# Patient Record
Sex: Male | Born: 1955 | Race: White | Hispanic: No | Marital: Married | Smoking: Former smoker
Health system: Southern US, Community
[De-identification: ages and names within clinical notes are randomized; demographics above are authoritative.]

## PROBLEM LIST (undated history)

## (undated) DIAGNOSIS — J45909 Unspecified asthma, uncomplicated: Secondary | ICD-10-CM

## (undated) DIAGNOSIS — Z86010 Personal history of colonic polyps: Secondary | ICD-10-CM

## (undated) DIAGNOSIS — N200 Calculus of kidney: Secondary | ICD-10-CM

## (undated) DIAGNOSIS — E291 Testicular hypofunction: Secondary | ICD-10-CM

## (undated) DIAGNOSIS — J309 Allergic rhinitis, unspecified: Secondary | ICD-10-CM

## (undated) DIAGNOSIS — B019 Varicella without complication: Secondary | ICD-10-CM

## (undated) DIAGNOSIS — K219 Gastro-esophageal reflux disease without esophagitis: Secondary | ICD-10-CM

## (undated) HISTORY — DX: Allergic rhinitis, unspecified: J30.9

## (undated) HISTORY — DX: Varicella without complication: B01.9

## (undated) HISTORY — PX: CERVICAL DISC SURGERY: SHX588

## (undated) HISTORY — DX: Personal history of colonic polyps: Z86.010

## (undated) HISTORY — DX: Calculus of kidney: N20.0

## (undated) HISTORY — DX: Testicular hypofunction: E29.1

## (undated) HISTORY — DX: Unspecified asthma, uncomplicated: J45.909

## (undated) HISTORY — DX: Gastro-esophageal reflux disease without esophagitis: K21.9

---

## 2003-10-28 HISTORY — PX: CHOLECYSTECTOMY: SHX55

## 2008-10-27 DIAGNOSIS — N200 Calculus of kidney: Secondary | ICD-10-CM

## 2008-10-27 HISTORY — DX: Calculus of kidney: N20.0

## 2017-05-18 ENCOUNTER — Ambulatory Visit (INDEPENDENT_AMBULATORY_CARE_PROVIDER_SITE_OTHER): Payer: 59 | Admitting: Family Medicine

## 2017-05-18 ENCOUNTER — Encounter: Payer: Self-pay | Admitting: Family Medicine

## 2017-05-18 DIAGNOSIS — Z87891 Personal history of nicotine dependence: Secondary | ICD-10-CM | POA: Diagnosis not present

## 2017-05-18 DIAGNOSIS — E663 Overweight: Secondary | ICD-10-CM | POA: Insufficient documentation

## 2017-05-18 DIAGNOSIS — M722 Plantar fascial fibromatosis: Secondary | ICD-10-CM | POA: Diagnosis not present

## 2017-05-18 DIAGNOSIS — J4520 Mild intermittent asthma, uncomplicated: Secondary | ICD-10-CM

## 2017-05-18 DIAGNOSIS — Z86010 Personal history of colonic polyps: Secondary | ICD-10-CM | POA: Insufficient documentation

## 2017-05-18 DIAGNOSIS — N200 Calculus of kidney: Secondary | ICD-10-CM | POA: Insufficient documentation

## 2017-05-18 DIAGNOSIS — E291 Testicular hypofunction: Secondary | ICD-10-CM | POA: Diagnosis not present

## 2017-05-18 DIAGNOSIS — K219 Gastro-esophageal reflux disease without esophagitis: Secondary | ICD-10-CM | POA: Insufficient documentation

## 2017-05-18 DIAGNOSIS — J45909 Unspecified asthma, uncomplicated: Secondary | ICD-10-CM | POA: Insufficient documentation

## 2017-05-18 DIAGNOSIS — J301 Allergic rhinitis due to pollen: Secondary | ICD-10-CM | POA: Diagnosis not present

## 2017-05-18 DIAGNOSIS — J309 Allergic rhinitis, unspecified: Secondary | ICD-10-CM | POA: Insufficient documentation

## 2017-05-18 MED ORDER — ALBUTEROL SULFATE HFA 108 (90 BASE) MCG/ACT IN AERS: 2.0000 | INHALATION_SPRAY | Freq: Four times a day (QID) | RESPIRATORY_TRACT | 2 refills | Status: AC | PRN

## 2017-05-18 MED ORDER — SILDENAFIL CITRATE 20 MG PO TABS: ORAL_TABLET | ORAL | 3 refills | Status: DC

## 2017-05-18 NOTE — Assessment & Plan Note (Signed)
S:started in [employer]. sparing albuterol controls symptoms well A/P: well controlled asthma- refilled today to make sure he has one on hand.

## 2017-05-18 NOTE — Assessment & Plan Note (Signed)
S:Thinks original diagnosis by urology then managed by prior PCP. testosterone cypionate 200mg /ml- 1 ml q 14 days. wife gives injection A/P: Discussed with patient we needed records before prescribing medication. He is to sign a release of information. I asked him to check in 10 days from now with [place] to see if we have gotten records and sent in prescription

## 2017-05-18 NOTE — Assessment & Plan Note (Signed)
5.25 pack years. Quit 2001. Needs AAA screen at age 61

## 2017-05-18 NOTE — Assessment & Plan Note (Signed)
S: By BMI patient is obese. But he has large muscle mass for age.Works out 5-6 days a week. Bench presses 300 pounds Believes exercise is best medicine- weights, cardio as well. He has already lost weight. 208 hs weight- played Fball but has gained muscle mass A/P:Wants to lose 10 more lbs at least - 225 goal. 238 at home. I agreed with him he should at least lose 10-15 pounds. Reassess at that time suspect he may able to lose more

## 2017-05-18 NOTE — Progress Notes (Signed)
Phone: [phone]  Subjective:  Patient presents today to establish care.  Prior patient of Dr. Wallis Mart- [facility] (now he has moved) Lived in [place] since early 2016. Chief complaint-noted.   See problem oriented charting  The following were reviewed and entered/updated in epic: Past Medical History:  Diagnosis Date  . Allergic rhinitis    claritin in the spring  . Asthma    started in [employer]. sparing albuterol  . Chicken pox   . GERD (gastroesophageal reflux disease)   . History of adenomatous polyp of colon    age 50- repeat planned at 37  . Hypogonadism in male    by prior PCP. testosterone cypionate 21m/ml- 1 ml q 14 days. wife gives injection  . Nephrolithiasis    x1 years ago   Patient Active Problem List   Diagnosis Date Noted  . Hypogonadism in male     Priority: High  . Plantar fasciitis, left 05/18/2017    Priority: Medium  . Asthma     Priority: Medium  . History of adenomatous polyp of colon     Priority: Medium  . Overweight 05/18/2017    Priority: Low  . Former smoker 05/18/2017    Priority: Low  . Allergic rhinitis     Priority: Low  . GERD (gastroesophageal reflux disease)     Priority: Low  . Nephrolithiasis     Priority: Low   Past Surgical History:  Procedure Laterality Date  . CERVICAL DISC SURGERY     c6-c7 fusion- used to jump out of airplanes  . CHOLECYSTECTOMY  2005    Family History  Problem Relation Age of Onset  . Diabetes Mother        died at 842 . Hyperlipidemia Mother   . Hypertension Mother   . CAD Mother        1 year after husband passed  . Hypertension Father   . Asthma Father   . Stroke Father        died 967 after falling and breaking 2 vertebrae and getting PNA  . Breast cancer Sister   . Alcoholism Brother        [place] . CAD Brother        cabg. smoker. 658 . Other Brother        opiate addiction  . Stomach cancer Maternal Grandmother        ? colon  . CAD Maternal Grandfather        died  541 drinker, not smoker  . Other Paternal Grandmother        pna 935 . CAD Paternal Grandfather        died 693 drink and smoking  . Lung cancer Maternal Uncle        smoker    Medications- reviewed and updated Current Outpatient Prescriptions  Medication Sig Dispense Refill  . Cholecalciferol (D3 HIGH POTENCY) 2000 units CAPS Take 1 capsule by mouth daily.    . Multiple Vitamin (MULTIVITAMIN) tablet Take 1 tablet by mouth daily.    . sildenafil (REVATIO) 20 MG tablet Take 2-5 tablets by mouth 30-60 minutes prior to sexual intercourse 90 tablet 3  . Testosterone Cypionate 200 MG/ML KIT Inject 1 mL into the muscle every 14 (fourteen) days.    .Marland Kitchenalbuterol (PROVENTIL HFA;VENTOLIN HFA) 108 (90 Base) MCG/ACT inhaler Inhale 2 puffs into the lungs every 6 (six) hours as needed for wheezing or shortness of breath. 1 Inhaler 2   No  current facility-administered medications for this visit.     Allergies-reviewed and updated No Known Allergies  Social History   Social History  . Marital status: Married    Spouse name: N/A  . Number of children: N/A  . Years of education: N/A   Social History Main Topics  . Smoking status: Former Smoker    Packs/day: 0.75    Years: 7.00    Quit date: 10/28/1999  . Smokeless tobacco: Never Used  . Alcohol use 1.2 - 1.8 oz/week    2 - 3 Standard drinks or equivalent per week  . Drug use: Unknown  . Sexual activity: Not Asked   Other Topics Concern  . None   Social History Narrative   Remarried. 3 children- 58, 34 (from 1st marriage- mom had decided didn't want to be a mom), 39 in 2018. 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  HENT: Negative for hearing loss and tinnitus.   Eyes: Negative for blurred vision and double vision.  Respiratory: Negative for cough and hemoptysis.   Cardiovascular: Negative for chest pain and palpitations.  Gastrointestinal: Negative for heartburn and nausea.  Genitourinary: Negative for dysuria and urgency.  Musculoskeletal: Positive for joint pain (knee pain- likely oa). Negative for back pain.  Skin: Negative for itching and rash.  Neurological: Negative for dizziness and headaches.  Endo/Heme/Allergies: Negative for polydipsia. Does not bruise/bleed easily.  Psychiatric/Behavioral: Negative for substance abuse and suicidal ideas.   Objective: BP 134/82 (BP Location: Left Arm, Patient Position: Sitting, Cuff Size: Large)   Pulse 69   Temp 98 F (36.7 C) (Oral)   Ht 6' 1.5" (1.867 m)   Wt 245 lb (111.1 kg)   SpO2 98%   BMI 31.89 kg/m  Gen: NAD, resting comfortably HEENT: Mucous membranes are moist. Oropharynx normal. TM normal. Eyes: sclera and lids normal, PERRLA Neck: no thyromegaly, no cervical lymphadenopathy CV: RRR no murmurs rubs or gallops Lungs: CTAB no crackles, wheeze, rhonchi Abdomen: soft/nontender/nondistended/normal bowel sounds. No rebound or guarding.  Ext: no edema Skin: warm, dry Neuro: 5/5 strength in upper and lower extremities, normal gait, normal reflexes MSK: Very tender to palpation in left heel at the insertion point of plantar fascia  Assessment/Plan:  Overweight S: By BMI patient is obese. But he has large muscle mass for age.Works out 5-6 days a week. Bench presses 300 pounds Believes exercise is best medicine- weights, cardio as well. He has already lost weight. 208 hs weight- played Fball but has gained muscle mass A/P:Wants to lose 10 more lbs at least - 225 goal. 238 at home. I agreed with him he should at least lose 10-15 pounds. Reassess at that time suspect he may able to lose  more  Former smoker 5.25 pack years. Quit 2001. Needs AAA screen at age 8  Hypogonadism in male S:Thinks original diagnosis by urology then managed by prior PCP. testosterone cypionate 267m/ml- 1 ml q 14 days. wife gives injection A/P: Discussed with patient we needed records before prescribing medication. He is to sign a release  of information. I asked him to check in 10 days from now with [place] to see if we have gotten records and sent in prescription   Asthma S:started in [employer]. sparing albuterol controls symptoms well A/P: well controlled asthma- refilled today to make sure he has one on hand.    Plantar fasciitis, left S: Complains of intense heel pain in the morning with first step. Usually gets better with activity. Can recur during the day as he rests. Has been going on for 2-3 months.  A/P: Exam and history suggestive of plantar fasciitis. Home exercises recommended. Gel heel cups were recommended. Follow-up at physical  Physical in one month  Meds ordered this encounter  Medications  . Testosterone Cypionate 200 MG/ML KIT    Sig: Inject 1 mL into the muscle every 14 (fourteen) days.  . Multiple Vitamin (MULTIVITAMIN) tablet    Sig: Take 1 tablet by mouth daily.  . Cholecalciferol (D3 HIGH POTENCY) 2000 units CAPS    Sig: Take 1 capsule by mouth daily.  Marland Kitchen DISCONTD: sildenafil (REVATIO) 20 MG tablet    Sig: Take 20 mg by mouth as needed. Take 1-5 tablets by mouth 30-60 minutes prior to sexual intercourse  . albuterol (PROVENTIL HFA;VENTOLIN HFA) 108 (90 Base) MCG/ACT inhaler    Sig: Inhale 2 puffs into the lungs every 6 (six) hours as needed for wheezing or shortness of breath.    Dispense:  1 Inhaler    Refill:  2  . sildenafil (REVATIO) 20 MG tablet    Sig: Take 2-5 tablets by mouth 30-60 minutes prior to sexual intercourse    Dispense:  90 tablet    Refill:  3   Return precautions advised. Garret Reddish, MD

## 2017-05-18 NOTE — Assessment & Plan Note (Signed)
S: Complains of intense heel pain in the morning with first step. Usually gets better with activity. Can recur during the day as he rests. Has been going on for 2-3 months.  A/P: Exam and history suggestive of plantar fasciitis. Home exercises recommended. Gel heel cups were recommended. Follow-up at physical

## 2017-05-18 NOTE — Patient Instructions (Addendum)
Consider/read about Shingrix- the new 2 shot series for shingles  Sign release of information at the check out desk for colonoscopy.   Sign release of information at the check out desk for records from prior doctor- need all records since in [place] particularly info on testosterone  Refilled albuterol  Love your weight goal of 225  Schedule physical in 1 month in hopes we get most of records back  I would check in 10 days from now to see if we have sent in testosterone if you haven't heard

## 2017-05-21 ENCOUNTER — Telehealth: Payer: Self-pay | Admitting: Family Medicine

## 2017-05-21 DIAGNOSIS — E291 Testicular hypofunction: Secondary | ICD-10-CM

## 2017-05-21 NOTE — Telephone Encounter (Signed)
I would personally have never tarted patient on testosterone based on test results. From prior physician assistant Glendora Score- "testosterone levels from prior lab testing were low normal for age"   Labs 06/22/15 were all normal before testosterone was started. Low normal is not a reason to start in my book.   Roselyn Reef- can you please inform patient that I will not be refilling testosterone. I would prefer he come off the injections completely then retest. If he is low on 2/3 on free testosterone then I can refer him to urology for new start treatment. These will need to be fasting labs between 8-9 Am and we can discuss this at future visit getting this set up.   Also for records: He was given injections in office up to 200/mg/ml q2 weeks then transitioned to home. on 10/06/16 patient had refill for testosterone- testosterone cypionate (depo-testosterone) 100mg /mL injection. Inject 1 mL into the thigh every 14 days.

## 2017-05-21 NOTE — Assessment & Plan Note (Signed)
I would personally have never tarted patient on testosterone based on test results. From prior physician assistant Glendora Score- "testosterone levels from prior lab testing were low normal for age"   Labs 06/22/15 were all normal before testosterone was started. Low normal is not a reason to start in my book.   Roselyn Reef- can you please inform patient that I will not be refilling testosterone. I would prefer he come off the injections completely then retest. If he is low on 2/3 on free testosterone then I can refer him to urology for new start treatment. These will need to be fasting labs between 8-9 Am and we can discuss this at future visit getting this set up.   Also for records: He was given injections in office up to 200/mg/ml q2 weeks then transitioned to home. on 10/06/16 patient had refill for testosterone- testosterone cypionate (depo-testosterone) 100mg /mL injection. Inject 1 mL into the thigh every 14 days.

## 2017-05-25 NOTE — Telephone Encounter (Signed)
Spoke with patient who verbalized understanding.

## 2017-05-25 NOTE — Telephone Encounter (Signed)
Called and left a voicemail message asking for a return phone call 

## 2017-06-23 ENCOUNTER — Ambulatory Visit (INDEPENDENT_AMBULATORY_CARE_PROVIDER_SITE_OTHER): Payer: 59 | Admitting: Family Medicine

## 2017-06-23 ENCOUNTER — Encounter: Payer: Self-pay | Admitting: Family Medicine

## 2017-06-23 VITALS — BP 118/78 | HR 60 | Temp 97.8°F | Ht 73.0 in | Wt 245.0 lb

## 2017-06-23 DIAGNOSIS — Z23 Encounter for immunization: Secondary | ICD-10-CM | POA: Diagnosis not present

## 2017-06-23 DIAGNOSIS — Z125 Encounter for screening for malignant neoplasm of prostate: Secondary | ICD-10-CM

## 2017-06-23 DIAGNOSIS — E291 Testicular hypofunction: Secondary | ICD-10-CM | POA: Diagnosis not present

## 2017-06-23 DIAGNOSIS — Z87891 Personal history of nicotine dependence: Secondary | ICD-10-CM

## 2017-06-23 DIAGNOSIS — Z13220 Encounter for screening for lipoid disorders: Secondary | ICD-10-CM | POA: Diagnosis not present

## 2017-06-23 DIAGNOSIS — Z0000 Encounter for general adult medical examination without abnormal findings: Secondary | ICD-10-CM | POA: Diagnosis not present

## 2017-06-23 DIAGNOSIS — J4520 Mild intermittent asthma, uncomplicated: Secondary | ICD-10-CM | POA: Diagnosis not present

## 2017-06-23 DIAGNOSIS — M722 Plantar fascial fibromatosis: Secondary | ICD-10-CM

## 2017-06-23 DIAGNOSIS — Z86010 Personal history of colonic polyps: Secondary | ICD-10-CM

## 2017-06-23 LAB — POC URINALSYSI DIPSTICK (AUTOMATED)
BILIRUBIN UA: NEGATIVE
Clarity, UA: NEGATIVE
GLUCOSE UA: NEGATIVE
Ketones, UA: NEGATIVE
Leukocytes, UA: NEGATIVE
Nitrite, UA: NEGATIVE
PH UA: 6 (ref 5.0–8.0)
Protein, UA: NEGATIVE
RBC UA: NEGATIVE
SPEC GRAV UA: 1.02 (ref 1.010–1.025)
UROBILINOGEN UA: 0.2 U/dL

## 2017-06-23 LAB — COMPREHENSIVE METABOLIC PANEL
ALBUMIN: 4.5 g/dL (ref 3.5–5.2)
ALK PHOS: 55 U/L (ref 39–117)
ALT: 27 U/L (ref 0–53)
AST: 27 U/L (ref 0–37)
BILIRUBIN TOTAL: 0.8 mg/dL (ref 0.2–1.2)
BUN: 19 mg/dL (ref 6–23)
CO2: 33 mEq/L — ABNORMAL HIGH (ref 19–32)
Calcium: 9.6 mg/dL (ref 8.4–10.5)
Chloride: 101 mEq/L (ref 96–112)
Creatinine, Ser: 1.18 mg/dL (ref 0.40–1.50)
GFR: 67.15 mL/min (ref >60.00)
GLUCOSE: 89 mg/dL (ref 70–99)
POTASSIUM: 4.2 meq/L (ref 3.5–5.1)
Sodium: 139 mEq/L (ref 135–145)
TOTAL PROTEIN: 7.2 g/dL (ref 6.0–8.3)

## 2017-06-23 LAB — CBC
HEMATOCRIT: 46.5 % (ref 39.0–52.0)
HEMOGLOBIN: 15.5 g/dL (ref 13.0–17.0)
MCHC: 33.4 g/dL (ref 30.0–36.0)
MCV: 90.6 fl (ref 78.0–100.0)
PLATELETS: 261 10*3/uL (ref 150.0–400.0)
RBC: 5.13 Mil/uL (ref 4.22–5.81)
RDW: 13.9 % (ref 11.5–15.5)
WBC: 5.3 10*3/uL (ref 4.0–10.5)

## 2017-06-23 LAB — LIPID PANEL
CHOL/HDL RATIO: 4
Cholesterol: 198 mg/dL (ref 0–200)
HDL: 48.7 mg/dL (ref >39.00)
LDL Cholesterol: 132 mg/dL — ABNORMAL HIGH (ref 0–99)
NONHDL: 149.34
Triglycerides: 87 mg/dL (ref 0.0–149.0)
VLDL: 17.4 mg/dL (ref 0.0–40.0)

## 2017-06-23 LAB — PSA: PSA: 1.09 ng/mL (ref 0.10–4.00)

## 2017-06-23 NOTE — Progress Notes (Signed)
Phone: [phone]  Subjective:  Patient presents today for their annual physical. Chief complaint-noted.   See problem oriented charting- ROS- full  review of systems was completed and negative except for: less fatigue and libido off testosterone  The following were reviewed and entered/updated in epic: Past Medical History:  Diagnosis Date  . Allergic rhinitis    claritin in the spring  . Asthma    started in army. sparing albuterol  . Chicken pox   . GERD (gastroesophageal reflux disease)   . History of adenomatous polyp of colon    age 53- repeat planned at 11  . Hypogonadism in male    by prior PCP. testosterone cypionate 249m/ml- 1 ml q 14 days. wife gives injection  . Nephrolithiasis    x1 years ago   Patient Active Problem List   Diagnosis Date Noted  . Hypogonadism in male     Priority: High  . Plantar fasciitis, left 05/18/2017    Priority: Medium  . Asthma     Priority: Medium  . History of adenomatous polyp of colon     Priority: Medium  . Overweight 05/18/2017    Priority: Low  . Former smoker 05/18/2017    Priority: Low  . Allergic rhinitis     Priority: Low  . GERD (gastroesophageal reflux disease)     Priority: Low  . Nephrolithiasis     Priority: Low   Past Surgical History:  Procedure Laterality Date  . CERVICAL DISC SURGERY     c6-c7 fusion- used to jump out of airplanes  . CHOLECYSTECTOMY  2005    Family History  Problem Relation Age of Onset  . Diabetes Mother        died at 869 . Hyperlipidemia Mother   . Hypertension Mother   . CAD Mother        1 year after husband passed  . Hypertension Father   . Asthma Father   . Stroke Father        died 933 after falling and breaking 2 vertebrae and getting PNA  . Breast cancer Sister   . Alcoholism Brother        [place] . CAD Brother        cabg. smoker. 675 . Other Brother        opiate addiction  . Stomach cancer Maternal Grandmother        ? colon  . CAD Maternal  Grandfather        died 577 drinker, not smoker  . Other Paternal Grandmother        pna 937 . CAD Paternal Grandfather        died 645 drink and smoking  . Lung cancer Maternal Uncle        smoker    Medications- reviewed and updated Current Outpatient Prescriptions  Medication Sig Dispense Refill  . albuterol (PROVENTIL HFA;VENTOLIN HFA) 108 (90 Base) MCG/ACT inhaler Inhale 2 puffs into the lungs every 6 (six) hours as needed for wheezing or shortness of breath. 1 Inhaler 2  . Cholecalciferol (D3 HIGH POTENCY) 2000 units CAPS Take 1 capsule by mouth daily.    . Multiple Vitamin (MULTIVITAMIN) tablet Take 1 tablet by mouth daily.    . sildenafil (REVATIO) 20 MG tablet Take 2-5 tablets by mouth 30-60 minutes prior to sexual intercourse 90 tablet 3  . Testosterone Cypionate 200 MG/ML KIT Inject 1 mL into the muscle every 14 (fourteen) days.     No current  facility-administered medications for this visit.     Allergies-reviewed and updated No Known Allergies  Social History   Social History  . Marital status: Married    Spouse name: N/A  . Number of children: N/A  . Years of education: N/A   Social History Main Topics  . Smoking status: Former Smoker    Packs/day: 0.75    Years: 7.00    Quit date: 10/28/1999  . Smokeless tobacco: Never Used  . Alcohol use 1.2 - 1.8 oz/week    2 - 3 Standard drinks or equivalent per week  . Drug use: Unknown  . Sexual activity: Not Asked   Other Topics Concern  . None   Social History Narrative   Remarried. 3 children- 26, 29 (from 1st marriage- mom had decided didn't want to be a mom), 28 in 2018. 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 Oropharynx normal Neck: no thyromegaly CV: RRR no murmurs rubs or gallops Lungs: CTAB no crackles, wheeze, rhonchi Abdomen: soft/nontender/nondistended/normal bowel sounds. No rebound or guarding.  Ext: no edema Skin: warm, dry Neuro: grossly normal, moves all extremities, PERRLA Rectal: normal tone, diffusely enlarged prostate, no masses or tenderness  Assessment/Plan:  61 y.o. male presenting for annual physical.  Health Maintenance counseling: 1. Anticipatory guidance: Patient counseled regarding regular dental exams -q6 months for most part, eye exams - annual exams due to lasik, wearing seatbelts.  2. Risk factor reduction:  Advised patient of need for regular exercise and diet rich and fruits and vegetables to reduce risk of heart attack and stroke. Exercise- 5-6 days a week. Diet-balanced. Large muscle mass- discussed again at least 10-15 lbs weigh loss.  Wt Readings from Last 3 Encounters:  06/23/17 245 lb (111.1 kg)  05/18/17 245 lb (111.1 kg)  3. Immunizations/screenings/ancillary studies- flu shot today . Tdap advised today. No hiv or hcv screen as gives blood regularly 4. Prostate cancer screening-  advised psa with labs, low risk rectal  Lab Results  Component Value Date   PSA 1.09 06/23/2017  5. Colon cancer screening -  polyp age 39 - planned repeat at age 66.  63. Skin cancer screening- advised regular sunscreen use. Denies worrisome, changing, or new skin lesions.   Status of chronic or acute concerns   Asthma- sparing albuterol  Plantar fasciitis- trial exercises and gel cups. Improved with these. SM referral if needed. Declines for now   claritin D last night- advised to avoid- initial BP up but better on repeat  BP Readings from Last 3 Encounters:    06/23/17 118/78  05/18/17 134/82    Hypogonadism in male From prior provider reported  Hypogonadism- testosterone 237m q14 days injections by wife. Has been off injections for 1 month now. His levels while low normal are still normal (on every check he has had to  date) and I would not continue medications at this level- he could get urology opinion if he would like- we offered this and he accepted. Complains of poor energy and low libido off testosterone. Lab Results  Component Value Date   TESTOSTERONE 312.8 06/23/2017    1 year CPE  Orders Placed This Encounter  Procedures  . Flu Vaccine QUAD 6+ mos IM (Fluarix)  . Tdap vaccine greater than or equal to 7yo IM  . CBC    [place]  . Comprehensive metabolic panel    [place]    Order Specific Question:   Has the patient fasted?    Answer:   No  . Lipid panel    [place]    Order Specific Question:   Has the patient fasted?    Answer:   No  . PSA  . CP2130 testosterone, total and free  . POCT Urinalysis Dipstick (Automated)   Return precautions advised.  Garret Reddish, MD

## 2017-06-23 NOTE — Patient Instructions (Addendum)
Flu shot today. Tdap today.   Depending on testosterone levels may need to recheck to verify low numbers. With your muscle mass- I would be surprised if you have true low testosterone (then again you have been on treatment)  Please stop by lab before you go (after immunizations)

## 2017-06-24 LAB — CP2130 TESTOSTERONE, TOTAL AND FREE
Sex Hormone Binding: 35 nmol/L (ref 22–77)
TESTOSTERONE-% FREE: 1.9 % (ref 1.6–2.9)
Testosterone, Free: 59.6 pg/mL (ref 47.0–244.0)
Testosterone: 312.8 ng/dL (ref 300–890)

## 2017-06-26 ENCOUNTER — Other Ambulatory Visit: Payer: Self-pay

## 2017-06-26 DIAGNOSIS — E349 Endocrine disorder, unspecified: Secondary | ICD-10-CM

## 2017-06-28 NOTE — Assessment & Plan Note (Signed)
From prior provider reported  Hypogonadism- testosterone 200mg  q14 days injections by wife. Has been off injections for 1 month now. His levels while low normal are still normal (on every check he has had to date) and I would not continue medications at this level- he could get urology opinion if he would like- we offered this and he accepted. Complains of poor energy and low libido off testosterone. Lab Results  Component Value Date   TESTOSTERONE 312.8 06/23/2017

## 2018-03-18 ENCOUNTER — Ambulatory Visit: Payer: 59 | Admitting: Podiatry

## 2018-03-18 ENCOUNTER — Ambulatory Visit (INDEPENDENT_AMBULATORY_CARE_PROVIDER_SITE_OTHER): Payer: 59

## 2018-03-18 ENCOUNTER — Other Ambulatory Visit: Payer: Self-pay | Admitting: Podiatry

## 2018-03-18 ENCOUNTER — Encounter: Payer: Self-pay | Admitting: Podiatry

## 2018-03-18 VITALS — BP 134/79 | HR 63

## 2018-03-18 DIAGNOSIS — M7662 Achilles tendinitis, left leg: Secondary | ICD-10-CM

## 2018-03-18 DIAGNOSIS — M79672 Pain in left foot: Secondary | ICD-10-CM

## 2018-03-18 MED ORDER — TRIAMCINOLONE ACETONIDE 10 MG/ML IJ SUSP
10.0000 mg | Freq: Once | INTRAMUSCULAR | Status: AC
  Administered 2018-03-18: 10 mg

## 2018-03-18 NOTE — Patient Instructions (Signed)

## 2018-03-22 NOTE — Progress Notes (Signed)
Subjective:   Patient ID: Kurt Richmond, male   DOB: 62 y.o.   MRN: [id number]   HPI Patient presents stating that he is having a lot of pain in the back of his left heel and is been present for a little while and is worsened over the last month.  He is having trouble wearing shoes or being able to do activities currently patient does not smoke and likes to be active   Review of Systems  All other systems reviewed and are negative.       Objective:  Physical Exam  Constitutional: He appears well-developed and well-nourished.  Cardiovascular: Intact distal pulses.  Pulmonary/Chest: Effort normal.  Musculoskeletal: Normal range of motion.  Neurological: He is alert.  Skin: Skin is warm.  Nursing note and vitals reviewed.   Neurovascular status intact muscle strength is adequate range of motion within normal limits with patient found to have posterior lateral pain of the left Achilles with good function of the Achilles itself and no pain in the center or medial side of the tendon.  Patient has good digital perfusion and is well oriented x3     Assessment:  Acute Achilles tendinitis left with also history of mild plantar fasciitis left which may be contributory on the lateral side of the tendon     Plan:  H&P condition reviewed and discussed injection and possible risk of rupture associated with it.  Patient wants to proceed and today I did a careful injection of the lateral side of the Achilles 3 mg dexamethasone Kenalog 5 mg Xylocaine advised on ice therapy supportive shoes and reduced activity.  Reappoint in the next several weeks to recheck  X-rays indicate that there is small spur but no indications of stress fracture arthritis

## 2018-08-09 ENCOUNTER — Ambulatory Visit (INDEPENDENT_AMBULATORY_CARE_PROVIDER_SITE_OTHER): Payer: No Typology Code available for payment source

## 2018-08-09 ENCOUNTER — Encounter: Payer: Self-pay | Admitting: Podiatry

## 2018-08-09 ENCOUNTER — Other Ambulatory Visit: Payer: Self-pay | Admitting: Podiatry

## 2018-08-09 ENCOUNTER — Ambulatory Visit: Payer: No Typology Code available for payment source | Admitting: Podiatry

## 2018-08-09 DIAGNOSIS — M79672 Pain in left foot: Secondary | ICD-10-CM

## 2018-08-09 DIAGNOSIS — M779 Enthesopathy, unspecified: Secondary | ICD-10-CM

## 2018-08-09 MED ORDER — TRIAMCINOLONE ACETONIDE 10 MG/ML IJ SUSP
10.0000 mg | Freq: Once | INTRAMUSCULAR | Status: AC
  Administered 2018-08-09: 10 mg

## 2018-08-11 NOTE — Progress Notes (Signed)
Subjective:   Patient ID: Kurt Richmond, male   DOB: 62 y.o.   MRN: [id number]   HPI Patient has developed a lot of discomfort in the left second MPJ and states is been hurting for around 2 months with no history of injury   ROS      Objective:  Physical Exam  Neurovascular status intact muscle strength is adequate patient found to have inflammation pain around the second MPJ left with fluid buildup around the joint surface with mild discomfort in the third MPJ     Assessment:  Inflammatory capsulitis second MPJ left with mild in the third MPJ left     Plan:  H&P condition reviewed and recommended focusing on this joint surface.  I did a proximal block of the area I aspirated the joint getting out a small amount of clear fluid and I injected with quarter cc dexamethasone Kenalog and applied thick padding along with rigid bottom shoes to take pressure off the joint surface.  Patient will be seen back in 2 weeks and may require other treatments depending on response  X-ray indicates there is no signs of stress fracture or advanced arthritis the joint with no elongation of the metatarsal noted

## 2018-08-20 ENCOUNTER — Ambulatory Visit (INDEPENDENT_AMBULATORY_CARE_PROVIDER_SITE_OTHER): Payer: No Typology Code available for payment source | Admitting: Podiatry

## 2018-08-20 ENCOUNTER — Encounter: Payer: Self-pay | Admitting: Podiatry

## 2018-08-20 DIAGNOSIS — M79672 Pain in left foot: Secondary | ICD-10-CM

## 2018-08-20 DIAGNOSIS — M779 Enthesopathy, unspecified: Secondary | ICD-10-CM | POA: Diagnosis not present

## 2018-08-23 NOTE — Progress Notes (Signed)
Subjective:   Patient ID: Kurt Richmond, male   DOB: 62 y.o.   MRN: [id number]   HPI Patient presents stating the left foot seems to be improved   ROS      Objective:  Physical Exam  Neurovascular status intact with significant reduction of pain of the left plantar foot and left forefoot     Assessment:  Doing well post fasciitis capsulitis-like symptomatology     Plan:  Reviewed physical therapy anti-inflammatories and discussed shoe gear modifications and reappoint for [place] to recheck his symptoms indicate

## 2018-09-14 ENCOUNTER — Ambulatory Visit: Payer: No Typology Code available for payment source | Admitting: Family Medicine

## 2018-09-14 ENCOUNTER — Encounter: Payer: Self-pay | Admitting: Family Medicine

## 2018-09-14 VITALS — BP 138/80 | HR 67 | Ht 73.0 in | Wt 256.0 lb

## 2018-09-14 DIAGNOSIS — Z1211 Encounter for screening for malignant neoplasm of colon: Secondary | ICD-10-CM | POA: Diagnosis not present

## 2018-09-14 DIAGNOSIS — E291 Testicular hypofunction: Secondary | ICD-10-CM | POA: Insufficient documentation

## 2018-09-14 DIAGNOSIS — E559 Vitamin D deficiency, unspecified: Secondary | ICD-10-CM | POA: Insufficient documentation

## 2018-09-14 DIAGNOSIS — Z0000 Encounter for general adult medical examination without abnormal findings: Secondary | ICD-10-CM | POA: Insufficient documentation

## 2018-09-14 NOTE — Addendum Note (Signed)
Addended by: Abelino Derrick A on: 09/14/2018 12:02 PM   Modules accepted: Orders

## 2018-09-14 NOTE — Patient Instructions (Signed)

## 2018-09-14 NOTE — Progress Notes (Addendum)
Subjective:  Patient ID: Kurt Richmond, male    DOB: [birth year]  Age: 62 y.o. MRN: [id number]  CC: Establish Care   HPI Kurt Richmond presents for establishment of care and for physical exam.  He enjoys good health.  He works out 6 out of 7 days.  He is married.  He does not smoke or use illicit drugs.  He drinks alcohol on occasion.  He takes testosterone for androgen deficiency and says that it helps his energy levels as well as his sexual stamina.  Revatio works well for ED issues.  He has no issues with urine flow.  He uses albuterol inhalers rarely for wheezing.  He quit smoking many years ago.  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  Last colonoscopy was 5 years ago and there were polyps.  He was advised to return in 5 years.  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  Outpatient Medications Prior to Visit  Medication Sig Dispense Refill  . albuterol (PROVENTIL HFA;VENTOLIN HFA) 108 (90 Base) MCG/ACT inhaler Inhale 2 puffs into the lungs every 6 (six) hours as needed for wheezing or shortness of breath. 1 Inhaler 2  . Cholecalciferol (D3 HIGH POTENCY) 2000 units CAPS Take 1 capsule by mouth daily.    . Multiple Vitamin (MULTIVITAMIN) tablet Take 1 tablet by mouth daily.    . sildenafil (REVATIO) 20 MG tablet Take 2-5 tablets by mouth 30-60 minutes prior to sexual intercourse 90 tablet 3  . Testosterone Cypionate 200 MG/ML KIT Inject 1 mL into the muscle every 14 (fourteen) days.     No facility-administered medications prior to visit.     ROS Review of Systems  Constitutional: Negative.   HENT: Negative.   Eyes: Negative.   Respiratory: Negative.   Cardiovascular: Negative.   Gastrointestinal: Negative.   Endocrine: Negative for polyphagia and polyuria.  Genitourinary: Negative for difficulty urinating, frequency and urgency.  Musculoskeletal: Negative  for gait problem and joint swelling.  Skin: Negative for pallor.  Allergic/Immunologic: Negative for immunocompromised state.  Neurological: Negative for seizures, speech difficulty and weakness.  Hematological: Does not bruise/bleed easily.  Psychiatric/Behavioral: Negative.     Objective:  BP 138/80 (BP Location: Left Arm, Patient Position: Sitting, Cuff Size: Normal)   Pulse 67   Ht _0  (1.854 m)   Wt 256 lb (116.1 kg)   SpO2 97%   BMI 33.78 kg/m   BP Readings from Last 3 Encounters:  09/14/18 138/80  03/18/18 134/79  06/23/17 118/78    Wt Readings from Last 3 Encounters:  09/14/18 256 lb (116.1 kg)  06/23/17 245 lb (111.1 kg)  05/18/17 245 lb (111.1 kg)    Physical Exam  Constitutional: He is oriented to person, place, and time. He appears well-developed and well-nourished. No distress.  HENT:  Head: Normocephalic and atraumatic.  Right Ear: External ear normal.  Left Ear: External ear normal.  Mouth/Throat: Oropharynx is clear and moist.  Eyes: Pupils are equal, round, and reactive to light. Conjunctivae and EOM are normal. Right eye exhibits no discharge. Left eye exhibits no discharge. No scleral icterus.  Neck: Neck supple. No JVD present. No tracheal deviation present. No thyromegaly present.  Cardiovascular: Normal rate, regular rhythm and normal heart sounds.  Pulmonary/Chest: Effort normal and breath sounds normal.  Abdominal: Soft. Bowel sounds are normal. He exhibits no distension. There  is no tenderness. There is no guarding.  Genitourinary: Rectal exam shows external hemorrhoid. Rectal exam shows no internal hemorrhoid, no fissure, no mass, no tenderness and guaiac negative stool. Prostate is enlarged. Prostate is not tender.  Musculoskeletal: He exhibits no edema.  Lymphadenopathy:    He has no cervical adenopathy.  Neurological: He is alert and oriented to person, place, and time.  Skin: Skin is warm and dry. No rash noted. He is not diaphoretic. No  erythema.    Lab Results  Component Value Date   WBC 5.3 06/23/2017   HGB 15.5 06/23/2017   HCT 46.5 06/23/2017   PLT 261.0 06/23/2017   GLUCOSE 89 06/23/2017   CHOL 198 06/23/2017   TRIG 87.0 06/23/2017   HDL 48.70 06/23/2017   LDLCALC 132 (H) 06/23/2017   ALT 27 06/23/2017   AST 27 06/23/2017   NA 139 06/23/2017   K 4.2 06/23/2017   CL 101 06/23/2017   CREATININE 1.18 06/23/2017   BUN 19 06/23/2017   CO2 33 (H) 06/23/2017   PSA 1.09 06/23/2017    Patient was never admitted.  Assessment & Plan:   Kurt Richmond was seen today for establish care.  Diagnoses and all orders for this visit:  Androgen deficiency -     Testosterone; Future  Healthcare maintenance -     Comprehensive metabolic panel; Future -     CBC; Future -     Lipid panel; Future -     PSA; Future -     Urinalysis, Routine w reflex microscopic; Future  Vitamin D deficiency -     VITAMIN D 25 Hydroxy (Vit-D Deficiency, Fractures); Future  Screen for colon cancer -     Ambulatory referral to Gastroenterology   I am having Kurt Richmond maintain his Testosterone Cypionate, multivitamin, Cholecalciferol, albuterol, and sildenafil.  No orders of the defined types were placed in this encounter.  Patient will follow-up for above ordered lab work a week after his next testosterone injection.  Follow-up: Return in about 6 months (around 03/15/2019).  Libby Maw, MD

## 2018-09-17 ENCOUNTER — Other Ambulatory Visit (INDEPENDENT_AMBULATORY_CARE_PROVIDER_SITE_OTHER): Payer: No Typology Code available for payment source

## 2018-09-17 ENCOUNTER — Telehealth: Payer: Self-pay | Admitting: Family Medicine

## 2018-09-17 DIAGNOSIS — E291 Testicular hypofunction: Secondary | ICD-10-CM | POA: Diagnosis not present

## 2018-09-17 DIAGNOSIS — Z0000 Encounter for general adult medical examination without abnormal findings: Secondary | ICD-10-CM

## 2018-09-17 DIAGNOSIS — E559 Vitamin D deficiency, unspecified: Secondary | ICD-10-CM

## 2018-09-17 LAB — URINALYSIS, ROUTINE W REFLEX MICROSCOPIC
Bilirubin Urine: NEGATIVE
Hgb urine dipstick: NEGATIVE
KETONES UR: NEGATIVE
Leukocytes, UA: NEGATIVE
Nitrite: NEGATIVE
PH: 5.5 (ref 5.0–8.0)
RBC / HPF: NONE SEEN (ref 0–?)
SPECIFIC GRAVITY, URINE: 1.02 (ref 1.000–1.030)
Total Protein, Urine: NEGATIVE
URINE GLUCOSE: NEGATIVE
UROBILINOGEN UA: 0.2 (ref 0.0–1.0)
WBC, UA: NONE SEEN (ref 0–?)

## 2018-09-17 LAB — COMPREHENSIVE METABOLIC PANEL
ALBUMIN: 4.1 g/dL (ref 3.5–5.2)
ALT: 21 U/L (ref 0–53)
AST: 22 U/L (ref 0–37)
Alkaline Phosphatase: 56 U/L (ref 39–117)
BUN: 23 mg/dL (ref 6–23)
CALCIUM: 8.8 mg/dL (ref 8.4–10.5)
CHLORIDE: 107 meq/L (ref 96–112)
CO2: 27 meq/L (ref 19–32)
Creatinine, Ser: 1.17 mg/dL (ref 0.40–1.50)
GFR: 67.53 mL/min (ref >60.00)
Glucose, Bld: 102 mg/dL — ABNORMAL HIGH (ref 70–99)
POTASSIUM: 4.1 meq/L (ref 3.5–5.1)
Sodium: 140 mEq/L (ref 135–145)
Total Bilirubin: 0.6 mg/dL (ref 0.2–1.2)
Total Protein: 6.3 g/dL (ref 6.0–8.3)

## 2018-09-17 LAB — LIPID PANEL
Cholesterol: 163 mg/dL (ref 0–200)
HDL: 45.1 mg/dL (ref >39.00)
LDL Cholesterol: 109 mg/dL — ABNORMAL HIGH (ref 0–99)
NonHDL: 117.96
TRIGLYCERIDES: 44 mg/dL (ref 0.0–149.0)
Total CHOL/HDL Ratio: 4
VLDL: 8.8 mg/dL (ref 0.0–40.0)

## 2018-09-17 LAB — VITAMIN D 25 HYDROXY (VIT D DEFICIENCY, FRACTURES): VITD: 40.08 ng/mL (ref 30.00–100.00)

## 2018-09-17 LAB — CBC
HEMATOCRIT: 44.2 % (ref 39.0–52.0)
HEMOGLOBIN: 15.2 g/dL (ref 13.0–17.0)
MCHC: 34.5 g/dL (ref 30.0–36.0)
MCV: 91.1 fl (ref 78.0–100.0)
PLATELETS: 275 10*3/uL (ref 150.0–400.0)
RBC: 4.85 Mil/uL (ref 4.22–5.81)
RDW: 12.8 % (ref 11.5–15.5)
WBC: 7.4 10*3/uL (ref 4.0–10.5)

## 2018-09-17 LAB — TESTOSTERONE: Testosterone: 196.6 ng/dL — ABNORMAL LOW (ref 300.00–890.00)

## 2018-09-17 LAB — PSA: PSA: 0.98 ng/mL (ref 0.10–4.00)

## 2018-09-17 NOTE — Telephone Encounter (Signed)
Spoke with patient-verbalized understanding. He would like his labs to be sent to [facility].

## 2018-09-17 NOTE — Telephone Encounter (Signed)
FYI  Pt called stated he forgot to tell Dr. Ethelene Hal that his lab needs to go to [facility]. We tried to call [facility] but his labs already processed all labs. Spoke with Dorann Ou and Ronnie B about this, they said at this point we have to wait until the pt gets the bill and give our office a call back ( it is too late to change anything and we are not sure how much different of the prices between [facility] and [facility]).   Waiting for the pt to call back.

## 2018-09-24 ENCOUNTER — Ambulatory Visit: Payer: Self-pay | Admitting: *Deleted

## 2018-09-24 NOTE — Telephone Encounter (Signed)
Pt called with complaints of an infected right great toe; he states that he went to get his nails done on 09/19/18; also says that he soaked his foot in salt water on 09/23/18 and 09/24/18 and applied neosporin; he rates his pain 6 out of 10; recommendations made to nurse triage protocol; the pt normally sees Dr Ethelene Hal; no appointments available at [facility] acute clinic today; pt verbalized understanding and will go to the urgent care; will route to office for notification; the pt normally sees Dr Ethelene Hal, Aud[facility] Lia.  Reason for Disposition . [1] Looks infected (spreading redness, pus) AND [2] large red area (> 2 in. or 5 cm)  Answer Assessment - Initial Assessment Questions 1. ONSET: "When did the pain start?"      09/19/18 2. LOCATION: "Where is the pain located?"   (e.g., around nail, entire toe, at foot joint)      Right great toe and around nail 3. PAIN: "How bad is the pain?"    (Scale 1-10; or mild, moderate, severe)   -  MILD (1-3): doesn't interfere with normal activities    -  MODERATE (4-7): interferes with normal activities (e.g., work or school) or awakens from sleep, limping    -  SEVERE (8-10): excruciating pain, unable to do any normal activities, unable to walk     6 out of 10 4. APPEARANCE: "What does the toe look like?" (e.g., redness, swelling, bruising, pallor)     Swelling; yellowish drainage 5. CAUSE: "What do you think is causing the toe pain?"     Had pedicure on 09/19/18 6. OTHER SYMPTOMS: "Do you have any other symptoms?" (e.g., leg pain, rash, fever, numbness)     Pain and drainage from area 7. PREGNANCY: "Is there any chance you are pregnant?" "When was your last menstrual period?"     n/a  Answer Assessment - Initial Assessment Questions 1. ONSET: "When did the pain start?"      09/21/18 2. LOCATION: "Where is the pain located?"     Side of right great toe and right side of toenail  3. PAIN: "How bad is the pain?"    (Scale 1-10; or mild, moderate, severe)   -   MILD (1-3): doesn't interfere with normal activities    -  MODERATE (4-7): interferes with normal activities (e.g., work or school) or awakens from sleep, limping    -  SEVERE (8-10): excruciating pain, unable to do any normal activities, unable to walk     6 out of 10 4. WORK OR EXERCISE: "Has there been any recent work or exercise that involved this part of the body?"    no 5. CAUSE: "What do you think is causing the foot pain?"     Infected tool when had pedicure  6. OTHER SYMPTOMS: "Do you have any other symptoms?" (e.g., leg pain, rash, fever, numbness)     Yellowish drainage from area; swelling 7. PREGNANCY: "Is there any chance you are pregnant?" "When was your last menstrual period?"     n/a  Protocols used: FOOT PAIN-A-AH, TOE PAIN-A-AH

## 2018-09-24 NOTE — Telephone Encounter (Signed)
Noted.   Dr. Ethelene Hal - FYI.

## 2018-11-19 ENCOUNTER — Telehealth: Payer: Self-pay

## 2018-11-19 NOTE — Telephone Encounter (Signed)
Sent a fax request for medical records to [facility] [phone] for Mr. Navarrete to make an appointment with Santina Evans 11/19/18  LM

## 2018-12-14 ENCOUNTER — Encounter: Payer: Self-pay | Admitting: Gastroenterology

## 2018-12-27 ENCOUNTER — Telehealth: Payer: Self-pay | Admitting: Podiatry

## 2018-12-27 NOTE — Telephone Encounter (Signed)
I'm calling to get a copy of my x-rays I had taken. Please call me back at [phone].

## 2019-01-07 ENCOUNTER — Ambulatory Visit: Payer: No Typology Code available for payment source | Admitting: Gastroenterology

## 2019-01-07 NOTE — Telephone Encounter (Signed)
Called pt to let him know I was placing his disc of x-rays up front for him to pick up. Told him he would need to fill out and sign a release form when he comes in for them.

## 2019-02-07 ENCOUNTER — Encounter: Payer: Self-pay | Admitting: Gastroenterology

## 2019-02-07 ENCOUNTER — Other Ambulatory Visit: Payer: Self-pay

## 2019-02-07 ENCOUNTER — Ambulatory Visit (INDEPENDENT_AMBULATORY_CARE_PROVIDER_SITE_OTHER): Payer: No Typology Code available for payment source | Admitting: Gastroenterology

## 2019-02-07 DIAGNOSIS — Z1211 Encounter for screening for malignant neoplasm of colon: Secondary | ICD-10-CM | POA: Diagnosis not present

## 2019-02-07 NOTE — Patient Instructions (Signed)
I am recommending a colonoscopy when the Covid19 restrictions have been lifted. We will contact you at that time to schedule the procedure.  Thank you for your patience with me and our technology today! Please stay home, safe, and healthy. I look forward to meeting you in person in the future.

## 2019-02-07 NOTE — Progress Notes (Signed)
TELEHEALTH VISIT  Referring Provider: Libby Maw Primary Care Physician:  Libby Maw, MD   Tele-visit due to COVID-19 pandemic Patient requested visit virtually, consented to the virtual encounter via video enabled telemedicine application. Encounter converted to telephone given the power outage in our office today.  Contact made at: 13:02 02/07/19 Patient verified by name and date of birth Location of patient: Home Location provider: My office Names of persons participating: Me, patient, Tinnie Gens CMA Time spent on telehealth visit: 18 minutes I discussed the limitations of evaluation and management by telemedicine. The patient expressed understanding and agreed to proceed.  Reason for Consultation:  Need for colonoscopy   IMPRESSION:  History of colon polyps on colonoscopy in 2010 and 2105 in [place] Mother with precancerous colon polyps No known family history of colon cancer  No ongoing GI symptoms  Colonoscopy recommended.  No prior difficulties reported during his previous colonoscopies.   PLAN: Obtain colonoscopy and pathology results from exams in 2010 and 2015 in [place] colonoscopy when Covid19 restrictions have been lifted  I consented the patient today discussing the risks, benefits, and alternatives to endoscopic evaluation. In particular, we discussed the risks that include, but are not limited to, reaction to medication, cardiopulmonary compromise, bleeding requiring blood transfusion, aspiration resulting in pneumonia, perforation requiring surgery, lack of diagnosis, severe illness requiring hospitalization, and even death. We reviewed the risk of missed lesion including polyps or even cancer. The patient acknowledges these risks and asks that we proceed.   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  History is obtained to the patient and review of his electronic health record.  He notes a history of  colon polyps removed on a screening colonoscopy in 2010.  Polyp was identified on surveillance endoscopy in 2015.  Both of those examinations were performed in [place] by a GI group on [employer].  No ongoing GI symptoms.  Review of systems is negative.  Mother with colon polyps and ovarian cancer. Died at 72 of a MI. No known family history of colon cancer or polyps. No family history of uterine/endometrial cancer, pancreatic cancer or gastric/stomach cancer.  Past Medical History:  Diagnosis Date  . Allergic rhinitis    claritin in the spring  . Asthma    started in [employer]. sparing albuterol  . Chicken pox   . GERD (gastroesophageal reflux disease)   . History of adenomatous polyp of colon    age 24- repeat planned at 62  . Hypogonadism in male    by prior PCP. testosterone cypionate 264m/ml- 1 ml q 14 days. wife gives injection  . Nephrolithiasis    x1 years ago    Past Surgical History:  Procedure Laterality Date  . CERVICAL DISC SURGERY     c6-c7 fusion- used to jump out of airplanes  . CHOLECYSTECTOMY  2005    Current Outpatient Medications  Medication Sig Dispense Refill  . albuterol (PROVENTIL HFA;VENTOLIN HFA) 108 (90 Base) MCG/ACT inhaler Inhale 2 puffs into the lungs every 6 (six) hours as needed for wheezing or shortness of breath. 1 Inhaler 2  . Cholecalciferol (D3 HIGH POTENCY) 2000 units CAPS Take 1 capsule by mouth daily.    . Multiple Vitamin (MULTIVITAMIN) tablet Take 1 tablet by mouth daily.    . sildenafil (REVATIO) 20 MG tablet Take 2-5 tablets by mouth 30-60 minutes prior to sexual intercourse 90 tablet 3  . Testosterone Cypionate 200 MG/ML KIT Inject  1 mL into the muscle every 14 (fourteen) days.     No current facility-administered medications for this visit.     Allergies as of 02/07/2019  . (No Known Allergies)    Family History  Problem Relation Age of Onset  . Diabetes Mother        died at 36  . Hyperlipidemia Mother   .  Hypertension Mother   . CAD Mother        1 year after husband passed  . Hypertension Father   . Asthma Father   . Stroke Father        died 71. after falling and breaking 2 vertebrae and getting PNA  . Breast cancer Sister   . Alcoholism Brother        [place]  . CAD Brother        cabg. smoker. 17  . Other Brother        opiate addiction  . Stomach cancer Maternal Grandmother        ? colon  . CAD Maternal Grandfather        died 18- drinker, not smoker  . Other Paternal Grandmother        pna 67  . CAD Paternal Grandfather        died 28- drink and smoking  . Lung cancer Maternal Uncle        smoker    Social History   Socioeconomic History  . Marital status: Married    Spouse name: Not on file  . Number of children: Not on file  . Years of education: Not on file  . Highest education level: Not on file  Occupational History  . Not on file  Social Needs  . Financial resource strain: Not on file  . Food insecurity:    Worry: Not on file    Inability: Not on file  . Transportation needs:    Medical: Not on file    Non-medical: Not on file  Tobacco Use  . Smoking status: Former Smoker    Packs/day: 0.75    Years: 7.00    Pack years: 5.25    Last attempt to quit: 10/28/1999    Years since quitting: 19.2  . Smokeless tobacco: Never Used  Substance and Sexual Activity  . Alcohol use: Yes    Alcohol/week: 2.0 - 3.0 standard drinks    Types: 2 - 3 Standard drinks or equivalent per week  . Drug use: Never  . Sexual activity: Not on file  Lifestyle  . Physical activity:    Days per week: Not on file    Minutes per session: Not on file  . Stress: Not on file  Relationships  . Social connections:    Talks on phone: Not on file    Gets together: Not on file    Attends religious service: Not on file    Active member of club or organization: Not on file    Attends meetings of clubs or organizations: Not on file    Relationship status: Not on file  . Intimate  partner violence:    Fear of current or ex partner: Not on file    Emotionally abused: Not on file    Physically abused: Not on file    Forced sexual activity: Not on file  Other Topics Concern  . Not on file  Social History Narrative   Remarried. 3 children- 45, 62 (from 1st marriage- mom had decided didn't want to be a mom), 48  in 2018. 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  Physical Exam: General: in no acute distress Neuro: Alert and appropriate Psych: Normal affect and normal insight Exam limited due to telehealth encounter  [place]. Tarri Glenn, MD, MPH [facility] 02/07/2019, 1:09 PM

## 2019-03-31 ENCOUNTER — Other Ambulatory Visit: Payer: Self-pay | Admitting: Emergency Medicine

## 2019-03-31 DIAGNOSIS — Z1211 Encounter for screening for malignant neoplasm of colon: Secondary | ICD-10-CM

## 2019-03-31 MED ORDER — NA SULFATE-K SULFATE-MG SULF 17.5-3.13-1.6 GM/177ML PO SOLN: 1.0000 | ORAL | 0 refills | Status: AC

## 2019-04-20 ENCOUNTER — Telehealth: Payer: Self-pay | Admitting: Family Medicine

## 2019-04-20 DIAGNOSIS — N529 Male erectile dysfunction, unspecified: Secondary | ICD-10-CM

## 2019-04-20 NOTE — Telephone Encounter (Signed)
sildenafil (REVATIO) 20 MG tablet   Kurt Richmond

## 2019-04-21 MED ORDER — SILDENAFIL CITRATE 20 MG PO TABS: ORAL_TABLET | ORAL | 3 refills | Status: DC

## 2019-04-25 ENCOUNTER — Ambulatory Visit: Payer: No Typology Code available for payment source | Admitting: Family Medicine

## 2019-04-25 ENCOUNTER — Encounter: Payer: Self-pay | Admitting: Family Medicine

## 2019-04-25 ENCOUNTER — Other Ambulatory Visit: Payer: Self-pay

## 2019-04-25 VITALS — BP 140/82 | HR 69 | Temp 98.0°F | Ht 73.0 in | Wt 259.0 lb

## 2019-04-25 DIAGNOSIS — R1031 Right lower quadrant pain: Secondary | ICD-10-CM | POA: Diagnosis not present

## 2019-04-25 DIAGNOSIS — S39011A Strain of muscle, fascia and tendon of abdomen, initial encounter: Secondary | ICD-10-CM

## 2019-04-25 DIAGNOSIS — E291 Testicular hypofunction: Secondary | ICD-10-CM

## 2019-04-25 LAB — CBC
HCT: 46.3 % (ref 39.0–52.0)
Hemoglobin: 15.8 g/dL (ref 13.0–17.0)
MCHC: 34.2 g/dL (ref 30.0–36.0)
MCV: 92.4 fl (ref 78.0–100.0)
Platelets: 298 10*3/uL (ref 150.0–400.0)
RBC: 5 Mil/uL (ref 4.22–5.81)
RDW: 13.1 % (ref 11.5–15.5)
WBC: 6.5 10*3/uL (ref 4.0–10.5)

## 2019-04-25 LAB — URINALYSIS, ROUTINE W REFLEX MICROSCOPIC
Bilirubin Urine: NEGATIVE
Hgb urine dipstick: NEGATIVE
Ketones, ur: NEGATIVE
Leukocytes,Ua: NEGATIVE
Nitrite: NEGATIVE
Specific Gravity, Urine: <=1.005 — AB (ref 1.000–1.030)
Total Protein, Urine: NEGATIVE
Urine Glucose: NEGATIVE
Urobilinogen, UA: 0.2 (ref 0.0–1.0)
pH: 6 (ref 5.0–8.0)

## 2019-04-25 MED ORDER — TESTOSTERONE CYPIONATE 200 MG/ML IM KIT: 1.0000 mL | PACK | INTRAMUSCULAR | 1 refills | Status: DC

## 2019-04-25 MED ORDER — METHOCARBAMOL 500 MG PO TABS: 500.0000 mg | ORAL_TABLET | Freq: Three times a day (TID) | ORAL | 0 refills | Status: DC

## 2019-04-25 MED ORDER — MELOXICAM 15 MG PO TABS: 15.0000 mg | ORAL_TABLET | Freq: Every day | ORAL | 0 refills | Status: DC

## 2019-04-25 NOTE — Patient Instructions (Signed)
Abdominal Pain, Adult Abdominal pain can be caused by many things. Often, abdominal pain is not serious and it gets better with no treatment or by being treated at home. However, sometimes abdominal pain is serious. Your health care provider will do a medical history and a physical exam to try to determine the cause of your abdominal pain. Follow these instructions at home:  Take over-the-counter and prescription medicines only as told by your health care provider. Do not take a laxative unless told by your health care provider.  Drink enough fluid to keep your urine clear or pale yellow.  Watch your condition for any changes.  Keep all follow-up visits as told by your health care provider. This is important. Contact a health care provider if:  Your abdominal pain changes or gets worse.  You are not hungry or you lose weight without trying.  You are constipated or have diarrhea for more than 2-3 days.  You have pain when you urinate or have a bowel movement.  Your abdominal pain wakes you up at night.  Your pain gets worse with meals, after eating, or with certain foods.  You are throwing up and cannot keep anything down.  You have a fever. Get help right away if:  Your pain does not go away as soon as your health care provider told you to expect.  You cannot stop throwing up.  Your pain is only in areas of the abdomen, such as the right side or the left lower portion of the abdomen.  You have bloody or black stools, or stools that look like tar.  You have severe pain, cramping, or bloating in your abdomen.  You have signs of dehydration, such as: ? Dark urine, very little urine, or no urine. ? Cracked lips. ? Dry mouth. ? Sunken eyes. ? Sleepiness. ? Weakness. This information is not intended to replace advice given to you by your health care provider. Make sure you discuss any questions you have with your health care provider. Document Released: 07/23/2005 Document  Revised: 05/02/2016 Document Reviewed: 03/26/2016 Elsevier Interactive Patient Education  2020 [employer]  

## 2019-04-25 NOTE — Progress Notes (Signed)
Established Patient Office Visit  Subjective:  Patient ID: Kurt Richmond, male    DOB: 01-12-1956  Age: 63 y.o. MRN: [id number]  CC:  Chief Complaint  Patient presents with   Abdominal Pain    pt is co of RLQ abd pain,worse when turn/ started last night.     HPI Kurt Richmond presents for evaluation and treatment of a 1 day history of right lower quadrant pain pain is been in his lower back and then in his right lower quadrant.  There was no injury.  There is been no nausea or vomiting fever chills diarrhea.  Normal bowel movement this morning.  Appetite is normal.  He feels the pain more when he twists to the left.  Denies blood in his urine or stool.  Past medical history of cholecystectomy some years ago.  History of renal lithiasis in the past and it does not feel like this.  Past Medical History:  Diagnosis Date   Allergic rhinitis    claritin in the spring   Asthma    started in army. sparing albuterol   Chicken pox    GERD (gastroesophageal reflux disease)    History of adenomatous polyp of colon    age 3- repeat planned at 59   Hypogonadism in male    by prior PCP. testosterone cypionate 279m/ml- 1 ml q 14 days. wife gives injection   Nephrolithiasis    x1 years ago    Past Surgical History:  Procedure Laterality Date   CERVICAL DISC SURGERY     c6-c7 fusion- used to jump out of airplanes   CHOLECYSTECTOMY  2005    Family History  Problem Relation Age of Onset   Diabetes Mother        died at 822  Hyperlipidemia Mother    Hypertension Mother    CAD Mother        1 year after husband passed   Hypertension Father    Asthma Father    Stroke Father        died 946 after falling and breaking 2 vertebrae and getting PNA   Breast cancer Sister    Alcoholism Brother        [place]  CAD Brother        cabg. smoker. 678  Other Brother        opiate addiction   Stomach cancer Maternal Grandmother        ? colon   CAD Maternal Grandfather         died 567 drinker, not smoker   Other Paternal Grandmother        pna 919  CAD Paternal Grandfather        died 618 drink and smoking   Lung cancer Maternal Uncle        smoker    Social History   Socioeconomic History   Marital status: Married    Spouse name: Not on file   Number of children: Not on file   Years of education: Not on file   Highest education level: Not on file  Occupational History   Not on file  Social Needs   Financial resource strain: Not on file   Food insecurity    Worry: Not on file    Inability: Not on file   Transportation needs    Medical: Not on file    Non-medical: Not on file  Tobacco Use   Smoking status: Former Smoker    Packs/day: 0.75  Years: 7.00    Pack years: 5.25    Quit date: 10/28/1999    Years since quitting: 19.5   Smokeless tobacco: Never Used  Substance and Sexual Activity   Alcohol use: Yes    Alcohol/week: 2.0 - 3.0 standard drinks    Types: 2 - 3 Standard drinks or equivalent per week   Drug use: Never   Sexual activity: Not on file  Lifestyle   Physical activity    Days per week: Not on file    Minutes per session: Not on file   Stress: Not on file  Relationships   Social connections    Talks on phone: Not on file    Gets together: Not on file    Attends religious service: Not on file    Active member of club or organization: Not on file    Attends meetings of clubs or organizations: Not on file    Relationship status: Not on file   Intimate partner violence    Fear of current or ex partner: Not on file    Emotionally abused: Not on file    Physically abused: Not on file    Forced sexual activity: Not on file  Other Topics Concern   Not on file  Social History Narrative   Remarried. 3 children- 33, 12 (from 1st marriage- mom had decided didn't want to be a mom), 74 in 2018. 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 1 Inhaler 2   Cholecalciferol (D3 HIGH POTENCY) 2000 units CAPS Take 1 capsule by mouth daily.     Multiple Vitamin (MULTIVITAMIN) tablet Take 1 tablet by mouth daily.     Na Sulfate-K Sulfate-Mg Sulf 17.5-3.13-1.6 GM/177ML SOLN Take 1 kit by mouth as directed for 30 days. 354 mL 0   sildenafil (REVATIO) 20 MG tablet Take 2-5 tablets by mouth 30-60 minutes prior to sexual intercourse 90 tablet 3   Testosterone Cypionate 200 MG/ML KIT Inject 1 mL into the muscle every 14 (fourteen) days.     No facility-administered medications prior to visit.     No Known Allergies  ROS Review of Systems  Constitutional: Negative for chills, diaphoresis, fatigue, fever and unexpected weight change.  HENT: Negative.   Eyes: Negative for photophobia and visual disturbance.  Respiratory: Negative.   Cardiovascular: Negative.   Gastrointestinal: Positive for abdominal pain. Negative for anal bleeding, blood in stool, constipation, diarrhea, nausea and vomiting.  Endocrine: Negative for polyphagia and polyuria.  Genitourinary: Negative for discharge, frequency, hematuria, testicular pain and urgency.  Musculoskeletal: Positive for back pain and myalgias.  Skin: Negative for pallor and rash.  Allergic/Immunologic: Negative for immunocompromised state.  Neurological: Negative for numbness.  Hematological: Does not bruise/bleed easily.  Psychiatric/Behavioral: Negative.       Objective:    Physical Exam  Constitutional: He is oriented to person, place, and  time. He appears well-developed and well-nourished. No  distress.  HENT:  Head: Normocephalic and atraumatic.  Right Ear: External ear normal.  Left Ear: External ear normal.  Mouth/Throat: Oropharynx is clear and moist. No oropharyngeal exudate.  Eyes: Pupils are equal, round, and reactive to light. Conjunctivae are normal. Right eye exhibits no discharge. Left eye exhibits no discharge. No scleral icterus.  Neck: No JVD present. No tracheal deviation present.  Cardiovascular: Normal rate, regular rhythm and normal heart sounds.  Pulmonary/Chest: Effort normal and breath sounds normal. No stridor.  Abdominal: Bowel sounds are normal. He exhibits no distension. There is no abdominal tenderness. There is no rebound and no guarding. Hernia confirmed negative in the right inguinal area and confirmed negative in the left inguinal area.  Genitourinary: Right testis shows no mass, no swelling and no tenderness. Right testis is descended. Left testis shows no mass, no swelling and no tenderness. Left testis is descended. No hypospadias or penile erythema. No discharge found.  Musculoskeletal:     Right hip: He exhibits normal range of motion, normal strength and no tenderness.  Lymphadenopathy:       Right: No inguinal adenopathy present.       Left: No inguinal adenopathy present.  Neurological: He is alert and oriented to person, place, and time.  Skin: Skin is dry. No rash noted. He is not diaphoretic.  Psychiatric: He has a normal mood and affect. His behavior is normal.    BP 140/82    Pulse 69    Temp 98 F (36.7 C) (Oral)    Ht '6\' 1"'  (1.854 m)    Wt 259 lb (117.5 kg)    SpO2 96%    BMI 34.17 kg/m  Wt Readings from Last 3 Encounters:  04/25/19 259 lb (117.5 kg)  09/14/18 256 lb (116.1 kg)  06/23/17 245 lb (111.1 kg)     There are no preventive care reminders to display for this patient.  There are no preventive care reminders to display for this patient.  No results found for:  TSH Lab Results  Component Value Date   WBC 7.4 09/17/2018   HGB 15.2 09/17/2018   HCT 44.2 09/17/2018   MCV 91.1 09/17/2018   PLT 275.0 09/17/2018   Lab Results  Component Value Date   NA 140 09/17/2018   K 4.1 09/17/2018   CO2 27 09/17/2018   GLUCOSE 102 (H) 09/17/2018   BUN 23 09/17/2018   CREATININE 1.17 09/17/2018   BILITOT 0.6 09/17/2018   ALKPHOS 56 09/17/2018   AST 22 09/17/2018   ALT 21 09/17/2018   PROT 6.3 09/17/2018   ALBUMIN 4.1 09/17/2018   CALCIUM 8.8 09/17/2018   GFR 67.53 09/17/2018   Lab Results  Component Value Date   CHOL 163 09/17/2018   Lab Results  Component Value Date   HDL 45.10 09/17/2018   Lab Results  Component Value Date   LDLCALC 109 (H) 09/17/2018   Lab Results  Component Value Date   TRIG 44.0 09/17/2018   Lab Results  Component Value Date   CHOLHDL 4 09/17/2018   No results found for: HGBA1C    Assessment & Plan:   Problem List Items Addressed This Visit      Endocrine   Androgen deficiency - Primary   Relevant Medications   Testosterone Cypionate 200 MG/ML KIT     Musculoskeletal and Integument   Sprain of abdominal wall   Relevant Medications   meloxicam (MOBIC) 15 MG tablet   methocarbamol (ROBAXIN) 500 MG tablet  Other   Right lower quadrant abdominal pain   Relevant Orders   CBC   Urinalysis, Routine w reflex microscopic      Meds ordered this encounter  Medications   Testosterone Cypionate 200 MG/ML KIT    Sig: Inject 1 mL into the muscle every 14 (fourteen) days.    Dispense:  6 kit    Refill:  1   meloxicam (MOBIC) 15 MG tablet    Sig: Take 1 tablet (15 mg total) by mouth daily.    Dispense:  30 tablet    Refill:  0   methocarbamol (ROBAXIN) 500 MG tablet    Sig: Take 1 tablet (500 mg total) by mouth 3 (three) times daily.    Dispense:  40 tablet    Refill:  0    Follow-up: No follow-ups on file.    Libby Maw, MD

## 2019-04-26 ENCOUNTER — Encounter: Payer: Self-pay | Admitting: Family Medicine

## 2019-04-26 ENCOUNTER — Ambulatory Visit (INDEPENDENT_AMBULATORY_CARE_PROVIDER_SITE_OTHER): Payer: No Typology Code available for payment source | Admitting: Family Medicine

## 2019-04-26 VITALS — BP 134/80 | HR 74 | Ht 73.0 in | Wt 260.0 lb

## 2019-04-26 DIAGNOSIS — R1031 Right lower quadrant pain: Secondary | ICD-10-CM

## 2019-04-26 DIAGNOSIS — S76911S Strain of unspecified muscles, fascia and tendons at thigh level, right thigh, sequela: Secondary | ICD-10-CM | POA: Diagnosis not present

## 2019-04-26 NOTE — Progress Notes (Deleted)
Established Patient Office Visit  Subjective:  Patient ID: Kurt Richmond, male    DOB: 1955/11/10  Age: 63 y.o. MRN: [id number]  CC:  Chief Complaint  Patient presents with  . Follow-up    HPI Kurt Richmond presents for ***  Past Medical History:  Diagnosis Date  . Allergic rhinitis    claritin in the spring  . Asthma    started in army. sparing albuterol  . Chicken pox   . GERD (gastroesophageal reflux disease)   . History of adenomatous polyp of colon    age 45- repeat planned at 28  . Hypogonadism in male    by prior PCP. testosterone cypionate 237m/ml- 1 ml q 14 days. wife gives injection  . Nephrolithiasis    x1 years ago    Past Surgical History:  Procedure Laterality Date  . CERVICAL DISC SURGERY     c6-c7 fusion- used to jump out of airplanes  . CHOLECYSTECTOMY  2005    Family History  Problem Relation Age of Onset  . Diabetes Mother        died at 833 . Hyperlipidemia Mother   . Hypertension Mother   . CAD Mother        1 year after husband passed  . Hypertension Father   . Asthma Father   . Stroke Father        died 960 after falling and breaking 2 vertebrae and getting PNA  . Breast cancer Sister   . Alcoholism Brother        [place] . CAD Brother        cabg. smoker. 670 . Other Brother        opiate addiction  . Stomach cancer Maternal Grandmother        ? colon  . CAD Maternal Grandfather        died 570 drinker, not smoker  . Other Paternal Grandmother        pna 948 . CAD Paternal Grandfather        died 663 drink and smoking  . Lung cancer Maternal Uncle        smoker    Social History   Socioeconomic History  . Marital status: Married    Spouse name: Not on file  . Number of children: Not on file  . Years of education: Not on file  . Highest education level: Not on file  Occupational History  . Not on file  Social Needs  . Financial resource strain: Not on file  . Food insecurity    Worry: Not on file    Inability:  Not on file  . Transportation needs    Medical: Not on file    Non-medical: Not on file  Tobacco Use  . Smoking status: Former Smoker    Packs/day: 0.75    Years: 7.00    Pack years: 5.25    Quit date: 10/28/1999    Years since quitting: 19.5  . Smokeless tobacco: Never Used  Substance and Sexual Activity  . Alcohol use: Yes    Alcohol/week: 2.0 - 3.0 standard drinks    Types: 2 - 3 Standard drinks or equivalent per week  . Drug use: Never  . Sexual activity: Not on file  Lifestyle  . Physical activity    Days per week: Not on file    Minutes per session: Not on file  . Stress: Not on file  Relationships  . Social connections  Talks on phone: Not on file    Gets together: Not on file    Attends religious service: Not on file    Active member of club or organization: Not on file    Attends meetings of clubs or organizations: Not on file    Relationship status: Not on file  . Intimate partner violence    Fear of current or ex partner: Not on file    Emotionally abused: Not on file    Physically abused: Not on file    Forced sexual activity: Not on file  Other Topics Concern  . Not on file  Social History Narrative   Remarried. 3 children- 57, 75 (from 1st marriage- mom had decided didn't want to be a mom), 56 in 2018. 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 albuterol (PROVENTIL HFA;VENTOLIN HFA) 108 (90 Base) MCG/ACT inhaler Inhale 2 puffs into the lungs every 6 (six) hours as needed for wheezing or shortness of breath. 1 Inhaler 2  . Cholecalciferol (D3 HIGH POTENCY) 2000 units CAPS Take 1 capsule by mouth daily.    . meloxicam  (MOBIC) 15 MG tablet Take 1 tablet (15 mg total) by mouth daily. 30 tablet 0  . methocarbamol (ROBAXIN) 500 MG tablet Take 1 tablet (500 mg total) by mouth 3 (three) times daily. 40 tablet 0  . Multiple Vitamin (MULTIVITAMIN) tablet Take 1 tablet by mouth daily.    . Na Sulfate-K Sulfate-Mg Sulf 17.5-3.13-1.6 GM/177ML SOLN Take 1 kit by mouth as directed for 30 days. 354 mL 0  . sildenafil (REVATIO) 20 MG tablet Take 2-5 tablets by mouth 30-60 minutes prior to sexual intercourse 90 tablet 3  . Testosterone Cypionate 200 MG/ML KIT Inject 1 mL into the muscle every 14 (fourteen) days. 6 kit 1   No facility-administered medications prior to visit.     No Known Allergies  ROS Review of Systems    Objective:    Physical Exam  BP 134/80   Pulse 74   Ht '6\' 1"'$  (1.854 m)   Wt 260 lb (117.9 kg)   SpO2 94%   BMI 34.30 kg/m  Wt Readings from Last 3 Encounters:  04/26/19 260 lb (117.9 kg)  04/25/19 259 lb (117.5 kg)  09/14/18 256 lb (116.1 kg)   BP Readings from Last 3 Encounters:  04/26/19 134/80  04/25/19 140/82  09/14/18 138/80   Guideline developer:  UpToDate (see UpToDate for funding source) Date Released: June 2014  There are no preventive care reminders to display for this patient.  There are no preventive care reminders to display for this patient.  No results found for: TSH Lab Results  Component Value Date   WBC 6.5 04/25/2019   HGB 15.8 04/25/2019   HCT 46.3 04/25/2019   MCV 92.4 04/25/2019   PLT 298.0 04/25/2019   Lab Results  Component Value Date   NA 140 09/17/2018   K 4.1 09/17/2018   CO2 27 09/17/2018   GLUCOSE 102 (H) 09/17/2018   BUN 23 09/17/2018   CREATININE 1.17 09/17/2018   BILITOT 0.6  09/17/2018   ALKPHOS 56 09/17/2018   AST 22 09/17/2018   ALT 21 09/17/2018   PROT 6.3 09/17/2018   ALBUMIN 4.1 09/17/2018   CALCIUM 8.8 09/17/2018   GFR 67.53 09/17/2018   Lab Results  Component Value Date   CHOL 163 09/17/2018   Lab Results  Component  Value Date   HDL 45.10 09/17/2018   Lab Results  Component Value Date   LDLCALC 109 (H) 09/17/2018   Lab Results  Component Value Date   TRIG 44.0 09/17/2018   Lab Results  Component Value Date   CHOLHDL 4 09/17/2018   No results found for: HGBA1C    Assessment & Plan:   Problem List Items Addressed This Visit    None      No orders of the defined types were placed in this encounter.   Follow-up: No follow-ups on file.

## 2019-04-26 NOTE — Progress Notes (Signed)
Established Patient Office Visit  Subjective:  Patient ID: Kurt Richmond, male    DOB: 1955/11/07  Age: 63 y.o. MRN: [id number]  CC:  Chief Complaint  Patient presents with  . Follow-up    HPI Kurt Richmond presents for follow-up of his right lower quadrant pain.  It has improved but still lingers to some degree.  Again he denies any nausea or vomiting changes in his stooling or your urinary functions.  Denies blood in his stool or urine.  No fever or chills.  Past Medical History:  Diagnosis Date  . Allergic rhinitis    claritin in the spring  . Asthma    started in army. sparing albuterol  . Chicken pox   . GERD (gastroesophageal reflux disease)   . History of adenomatous polyp of colon    age 74- repeat planned at 68  . Hypogonadism in male    by prior PCP. testosterone cypionate 265m/ml- 1 ml q 14 days. wife gives injection  . Nephrolithiasis    x1 years ago    Past Surgical History:  Procedure Laterality Date  . CERVICAL DISC SURGERY     c6-c7 fusion- used to jump out of airplanes  . CHOLECYSTECTOMY  2005    Family History  Problem Relation Age of Onset  . Diabetes Mother        died at 81 . Hyperlipidemia Mother   . Hypertension Mother   . CAD Mother        1 year after husband passed  . Hypertension Father   . Asthma Father   . Stroke Father        died 943 after falling and breaking 2 vertebrae and getting PNA  . Breast cancer Sister   . Alcoholism Brother        [place] . CAD Brother        cabg. smoker. 646 . Other Brother        opiate addiction  . Stomach cancer Maternal Grandmother        ? colon  . CAD Maternal Grandfather        died 535 drinker, not smoker  . Other Paternal Grandmother        pna 91 . CAD Paternal Grandfather        died 643 drink and smoking  . Lung cancer Maternal Uncle        smoker    Social History   Socioeconomic History  . Marital status: Married    Spouse name: Not on file  . Number of children: Not on  file  . Years of education: Not on file  . Highest education level: Not on file  Occupational History  . Not on file  Social Needs  . Financial resource strain: Not on file  . Food insecurity    Worry: Not on file    Inability: Not on file  . Transportation needs    Medical: Not on file    Non-medical: Not on file  Tobacco Use  . Smoking status: Former Smoker    Packs/day: 0.75    Years: 7.00    Pack years: 5.25    Quit date: 10/28/1999    Years since quitting: 19.5  . Smokeless tobacco: Never Used  Substance and Sexual Activity  . Alcohol use: Yes    Alcohol/week: 2.0 - 3.0 standard drinks    Types: 2 - 3 Standard drinks or equivalent per week  . Drug use: Never  .  Sexual activity: Not on file  Lifestyle  . Physical activity    Days per week: Not on file    Minutes per session: Not on file  . Stress: Not on file  Relationships  . Social Herbalist on phone: Not on file    Gets together: Not on file    Attends religious service: Not on file    Active member of club or organization: Not on file    Attends meetings of clubs or organizations: Not on file    Relationship status: Not on file  . Intimate partner violence    Fear of current or ex partner: Not on file    Emotionally abused: Not on file    Physically abused: Not on file    Forced sexual activity: Not on file  Other Topics Concern  . Not on file  Social History Narrative   Remarried. 3 children- 42, 56 (from 1st marriage- mom had decided didn't want to be a mom), 39 in 2018. 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 albuterol (PROVENTIL HFA;VENTOLIN HFA) 108  (90 Base) MCG/ACT inhaler Inhale 2 puffs into the lungs every 6 (six) hours as needed for wheezing or shortness of breath. 1 Inhaler 2  . Cholecalciferol (D3 HIGH POTENCY) 2000 units CAPS Take 1 capsule by mouth daily.    . meloxicam (MOBIC) 15 MG tablet Take 1 tablet (15 mg total) by mouth daily. 30 tablet 0  . methocarbamol (ROBAXIN) 500 MG tablet Take 1 tablet (500 mg total) by mouth 3 (three) times daily. 40 tablet 0  . Multiple Vitamin (MULTIVITAMIN) tablet Take 1 tablet by mouth daily.    . Na Sulfate-K Sulfate-Mg Sulf 17.5-3.13-1.6 GM/177ML SOLN Take 1 kit by mouth as directed for 30 days. 354 mL 0  . sildenafil (REVATIO) 20 MG tablet Take 2-5 tablets by mouth 30-60 minutes prior to sexual intercourse 90 tablet 3  . Testosterone Cypionate 200 MG/ML KIT Inject 1 mL into the muscle every 14 (fourteen) days. 6 kit 1   No facility-administered medications prior to visit.     No Known Allergies  ROS Review of Systems  Constitutional: Negative.  Negative for diaphoresis, fatigue, fever and unexpected weight change.  HENT: Negative.   Respiratory: Negative.   Cardiovascular: Negative.   Gastrointestinal: Positive for abdominal pain. Negative for abdominal distention, blood in stool, constipation, diarrhea, nausea and vomiting.  Genitourinary: Negative for frequency, hematuria and urgency.  Musculoskeletal: Positive for back pain.  Skin: Negative.   Hematological: Does not bruise/bleed easily.  Psychiatric/Behavioral: Negative.       Objective:    Physical Exam  Constitutional: He is oriented to person, place, and time. He appears well-developed and well-nourished. No distress.  HENT:  Head: Normocephalic and atraumatic.  Right Ear: External ear normal.  Left Ear: External ear normal.  Eyes: Right eye exhibits no discharge. Left eye exhibits no discharge. No scleral icterus.  Neck: No JVD present. No tracheal deviation present.  Pulmonary/Chest: Effort normal. No stridor.   Abdominal: Bowel sounds are normal. He  exhibits distension. There is no abdominal tenderness. There is no rebound.  Musculoskeletal:     Right upper leg: He exhibits no tenderness and no bony tenderness.       Legs:  Neurological: He is alert and oriented to person, place, and time.  Skin: Skin is warm and dry. He is not diaphoretic.  Psychiatric: He has a normal mood and affect. His behavior is normal.    BP 134/80   Pulse 74   Ht '6\' 1"'  (5.093 m)   Wt 260 lb (117.9 kg)   SpO2 94%   BMI 34.30 kg/m  Wt Readings from Last 3 Encounters:  04/26/19 260 lb (117.9 kg)  04/25/19 259 lb (117.5 kg)  09/14/18 256 lb (116.1 kg)     There are no preventive care reminders to display for this patient.  There are no preventive care reminders to display for this patient.  No results found for: TSH Lab Results  Component Value Date   WBC 6.5 04/25/2019   HGB 15.8 04/25/2019   HCT 46.3 04/25/2019   MCV 92.4 04/25/2019   PLT 298.0 04/25/2019   Lab Results  Component Value Date   NA 140 09/17/2018   K 4.1 09/17/2018   CO2 27 09/17/2018   GLUCOSE 102 (H) 09/17/2018   BUN 23 09/17/2018   CREATININE 1.17 09/17/2018   BILITOT 0.6 09/17/2018   ALKPHOS 56 09/17/2018   AST 22 09/17/2018   ALT 21 09/17/2018   PROT 6.3 09/17/2018   ALBUMIN 4.1 09/17/2018   CALCIUM 8.8 09/17/2018   GFR 67.53 09/17/2018   Lab Results  Component Value Date   CHOL 163 09/17/2018   Lab Results  Component Value Date   HDL 45.10 09/17/2018   Lab Results  Component Value Date   LDLCALC 109 (H) 09/17/2018   Lab Results  Component Value Date   TRIG 44.0 09/17/2018   Lab Results  Component Value Date   CHOLHDL 4 09/17/2018   No results found for: HGBA1C    Assessment & Plan:   Problem List Items Addressed This Visit      Other   Right lower quadrant abdominal pain - Primary   Relevant Orders   CBC   Urinalysis, Routine w reflex microscopic    Other Visit Diagnoses    Strain of  iliopsoas muscle, right, sequela          No orders of the defined types were placed in this encounter.   Follow-up: Return Call Thursday if not continuing to improve.Libby Maw, MD

## 2019-04-27 LAB — URINALYSIS, ROUTINE W REFLEX MICROSCOPIC
Bilirubin Urine: NEGATIVE
Glucose, UA: NEGATIVE
Hgb urine dipstick: NEGATIVE
Ketones, ur: NEGATIVE
Leukocytes,Ua: NEGATIVE
Nitrite: NEGATIVE
Protein, ur: NEGATIVE
Specific Gravity, Urine: 1.019 (ref 1.001–1.03)
pH: 6 (ref 5.0–8.0)

## 2019-04-27 LAB — CBC
HCT: 45.2 % (ref 38.5–50.0)
Hemoglobin: 15.9 g/dL (ref 13.2–17.1)
MCH: 31.9 pg (ref 27.0–33.0)
MCHC: 35.2 g/dL (ref 32.0–36.0)
MCV: 90.6 fL (ref 80.0–100.0)
MPV: 8.6 fL (ref 7.5–12.5)
Platelets: 304 10*3/uL (ref 140–400)
RBC: 4.99 10*6/uL (ref 4.20–5.80)
RDW: 12.6 % (ref 11.0–15.0)
WBC: 7 10*3/uL (ref 3.8–10.8)

## 2019-05-05 ENCOUNTER — Telehealth: Payer: Self-pay | Admitting: Gastroenterology

## 2019-05-05 NOTE — Telephone Encounter (Signed)
No to all answer °

## 2019-05-05 NOTE — Telephone Encounter (Signed)

## 2019-05-06 ENCOUNTER — Ambulatory Visit (AMBULATORY_SURGERY_CENTER): Payer: No Typology Code available for payment source | Admitting: Gastroenterology

## 2019-05-06 ENCOUNTER — Other Ambulatory Visit: Payer: Self-pay

## 2019-05-06 ENCOUNTER — Encounter: Payer: Self-pay | Admitting: Gastroenterology

## 2019-05-06 VITALS — BP 124/69 | HR 51 | Temp 97.5°F | Resp 14 | Ht 73.0 in | Wt 260.0 lb

## 2019-05-06 DIAGNOSIS — D124 Benign neoplasm of descending colon: Secondary | ICD-10-CM | POA: Diagnosis not present

## 2019-05-06 DIAGNOSIS — D125 Benign neoplasm of sigmoid colon: Secondary | ICD-10-CM

## 2019-05-06 DIAGNOSIS — Z86010 Personal history of colonic polyps: Secondary | ICD-10-CM | POA: Diagnosis present

## 2019-05-06 DIAGNOSIS — D122 Benign neoplasm of ascending colon: Secondary | ICD-10-CM

## 2019-05-06 DIAGNOSIS — Z1211 Encounter for screening for malignant neoplasm of colon: Secondary | ICD-10-CM

## 2019-05-06 DIAGNOSIS — D123 Benign neoplasm of transverse colon: Secondary | ICD-10-CM | POA: Diagnosis not present

## 2019-05-06 MED ORDER — SODIUM CHLORIDE 0.9 % IV SOLN: 500.0000 mL | Freq: Once | INTRAVENOUS | Status: DC

## 2019-05-06 NOTE — Patient Instructions (Signed)
6 polyps removed today You will receive letter from Dr Tarri Glenn in 2-3 weeks If you can not locate a letter by then please call [phone] and we will give you your results    YOU HAD AN ENDOSCOPIC PROCEDURE TODAY AT [place]:   Refer to the procedure report that was given to you for any specific questions about what was found during the examination.  If the procedure report does not answer your questions, please call your gastroenterologist to clarify.  If you requested that your care partner not be given the details of your procedure findings, then the procedure report has been included in a sealed envelope for you to review at your convenience later.  YOU SHOULD EXPECT: Some feelings of bloating in the abdomen. Passage of more gas than usual.  Walking can help get rid of the air that was put into your GI tract during the procedure and reduce the bloating. If you had a lower endoscopy (such as a colonoscopy or flexible sigmoidoscopy) you may notice spotting of blood in your stool or on the toilet paper. If you underwent a bowel prep for your procedure, you may not have a normal bowel movement for a few days.  Please Note:  You might notice some irritation and congestion in your nose or some drainage.  This is from the oxygen used during your procedure.  There is no need for concern and it should clear up in a day or so.  SYMPTOMS TO REPORT IMMEDIATELY:   Following lower endoscopy (colonoscopy or flexible sigmoidoscopy):  Excessive amounts of blood in the stool  Significant tenderness or worsening of abdominal pains  Swelling of the abdomen that is new, acute  Fever of 100F or higher  For urgent or emergent issues, a gastroenterologist can be reached at any hour by calling [phone].   DIET:  We do recommend a small meal at first, but then you may proceed to your regular diet.  Drink plenty of fluids but you should avoid alcoholic beverages for 24  hours.  ACTIVITY:  You should plan to take it easy for the rest of today and you should NOT DRIVE or use heavy machinery until tomorrow (because of the sedation medicines used during the test).    FOLLOW UP: Our staff will call the number listed on your records 48-72 hours following your procedure to check on you and address any questions or concerns that you may have regarding the information given to you following your procedure. If we do not reach you, we will leave a message.  We will attempt to reach you two times.  During this call, we will ask if you have developed any symptoms of COVID 19. If you develop any symptoms (ie: fever, flu-like symptoms, shortness of breath, cough etc.) before then, please call [phone].  If you test positive for Covid 19 in the 2 weeks post procedure, please call and report this information to [place].    If any biopsies were taken you will be contacted by phone or by letter within the next 1-3 weeks.  Please call us at [phone] if you have not heard about the biopsies in 3 weeks.    SIGNATURES/CONFIDENTIALITY: You and/or your care partner have signed paperwork which will be entered into your electronic medical record.  These signatures attest to the fact that that the information above on your After Visit Summary has been reviewed and is understood.  Full responsibility of the confidentiality of this  discharge information lies with you and/or your care-partner. 

## 2019-05-06 NOTE — Progress Notes (Signed)
PT taken to PACU. Monitors in place. VSS. Report given to RN. 

## 2019-05-06 NOTE — Progress Notes (Signed)
Pt's states no medical or surgical changes since previsit or office visit.  Temp per [place], VS per CW

## 2019-05-06 NOTE — Progress Notes (Signed)
Called to room to assist during endoscopic procedure.  Patient ID and intended procedure confirmed with present staff. Received instructions for my participation in the procedure from the performing physician.  

## 2019-05-06 NOTE — Op Note (Signed)
[facility] Patient Name: Kurt Richmond Procedure Date: 05/06/2019 10:57 AM MRN: [id number] Endoscopist: Thornton Park MD, MD Age: 63 Referring MD:  Date of Birth: [birth year] Gender: Male Account #: [id number] Procedure:                Colonoscopy Indications:              Surveillance: Personal history of adenomatous                            polyps on last colonoscopy 5 years ago                           History of colon polyps on colonoscopy in 2010 and                            2105 in [place]                           Mother with precancerous colon polyps                           No known family history of colon cancer                           No ongoing GI symptoms. Medicines:                See the Anesthesia note for documentation of the                            administered medications Procedure:                Pre-Anesthesia Assessment:                           - Prior to the procedure, a History and Physical                            was performed, and patient medications and                            allergies were reviewed. The patient's tolerance of                            previous anesthesia was also reviewed. The risks                            and benefits of the procedure and the sedation                            options and risks were discussed with the patient.                            All questions were answered, and informed consent  was obtained. Prior Anticoagulants: The patient has                            taken no previous anticoagulant or antiplatelet                            agents. ASA Grade Assessment: II - A patient with                            mild systemic disease. After reviewing the risks                            and benefits, the patient was deemed in                            satisfactory condition to undergo the procedure.                           After obtaining informed consent,  the colonoscope                            was passed under direct vision. Throughout the                            procedure, the patient's blood pressure, pulse, and                            oxygen saturations were monitored continuously. The                            Colonoscope was introduced through the anus and                            advanced to the the terminal ileum, with                            identification of the appendiceal orifice and IC                            valve. A second forward view of the right colon was                            performed. The colonoscopy was performed without                            difficulty. The patient tolerated the procedure                            well. The quality of the bowel preparation was                            good. The terminal ileum, ileocecal valve,  appendiceal orifice, and rectum were photographed. Scope In: 11:03:12 AM Scope Out: 11:25:11 AM Scope Withdrawal Time: 0 hours 17 minutes 13 seconds  Total Procedure Duration: 0 hours 21 minutes 59 seconds  Findings:                 The perianal and digital rectal examinations were                            normal.                           A 6 mm polyp was found in the ascending colon. The                            polyp was multi-lobulated. The polyp was removed                            with a cold snare. Resection and retrieval were                            complete. Estimated blood loss was minimal.                           A 1 mm polyp was found in the hepatic flexure. The                            polyp was sessile. The polyp was removed with a                            cold biopsy forceps. Resection and retrieval were                            complete. Estimated blood loss: none.                           Three sessile polyps were found in the splenic                            flexure. The polyps were 2 to 3 mm in  size. These                            polyps were removed with a cold snare. Resection                            and retrieval were complete.                           A 8 mm polyp was found in the sigmoid colon. The                            polyp was pedunculated. The polyp was removed with  a cold snare. Resection and retrieval were                            complete. Estimated blood loss was minimal.                           Multiple small and large-mouthed diverticula were                            found in the sigmoid colon, descending colon and                            ascending colon.                           The exam was otherwise without abnormality on                            direct and retroflexion views. Complications:            No immediate complications. Estimated blood loss:                            Minimal. Estimated Blood Loss:     Estimated blood loss was minimal. Impression:               - One 6 mm polyp in the ascending colon, removed                            with a cold snare. Resected and retrieved.                           - One 1 mm polyp at the hepatic flexure, removed                            with a cold biopsy forceps. Resected and retrieved.                           - Three 2 to 3 mm polyps at the splenic flexure,                            removed with a cold snare. Resected and retrieved.                           - One 8 mm polyp in the sigmoid colon, removed with                            a cold snare. Resected and retrieved.                           - Diverticulosis in the sigmoid colon, in the                            descending colon and in the ascending colon.                           -  The examination was otherwise normal on direct                            and retroflexion views. Recommendation:           - Patient has a contact number available for                            emergencies. The signs  and symptoms of potential                            delayed complications were discussed with the                            patient. Return to normal activities tomorrow.                            Written discharge instructions were provided to the                            patient.                           - High fiber diet today.                           - Continue present medications.                           - Await pathology results.                           - Repeat colonoscopy in 3 years for surveillance if                            at least 3 polyps are adenomatous. Otherwise,                            repeat colonoscopy in 7 years. Thornton Park MD, MD 05/06/2019 11:35:25 AM This report has been signed electronically.

## 2019-05-10 ENCOUNTER — Telehealth: Payer: Self-pay | Admitting: *Deleted

## 2019-05-10 ENCOUNTER — Encounter: Payer: Self-pay | Admitting: Gastroenterology

## 2019-05-10 NOTE — Telephone Encounter (Signed)
  Follow up Call-  Call back number 05/06/2019  Post procedure Call Back phone  # [phone]  Permission to leave phone message Yes  Some recent data might be hidden    [facility]

## 2019-09-15 ENCOUNTER — Telehealth: Payer: Self-pay

## 2019-09-15 NOTE — Telephone Encounter (Signed)

## 2019-09-16 ENCOUNTER — Encounter: Payer: Self-pay | Admitting: Family Medicine

## 2019-09-16 ENCOUNTER — Ambulatory Visit (INDEPENDENT_AMBULATORY_CARE_PROVIDER_SITE_OTHER): Payer: PRIVATE HEALTH INSURANCE | Admitting: Family Medicine

## 2019-09-16 ENCOUNTER — Other Ambulatory Visit: Payer: Self-pay

## 2019-09-16 VITALS — BP 136/80 | HR 74 | Ht 73.0 in | Wt 263.0 lb

## 2019-09-16 DIAGNOSIS — E559 Vitamin D deficiency, unspecified: Secondary | ICD-10-CM | POA: Diagnosis not present

## 2019-09-16 DIAGNOSIS — E041 Nontoxic single thyroid nodule: Secondary | ICD-10-CM | POA: Diagnosis not present

## 2019-09-16 DIAGNOSIS — Z0000 Encounter for general adult medical examination without abnormal findings: Secondary | ICD-10-CM | POA: Diagnosis not present

## 2019-09-16 DIAGNOSIS — E291 Testicular hypofunction: Secondary | ICD-10-CM | POA: Diagnosis not present

## 2019-09-16 NOTE — Progress Notes (Signed)
Established Patient Office Visit  Subjective:  Patient ID: Kurt Richmond, male    DOB: [birth year]  Age: 63 y.o. MRN: [id number]  CC:  Chief Complaint  Patient presents with  . Annual Exam    HPI Kurt Richmond presents for his annual physical.  Doing well.  Status post dental check earlier this year.  Continues to exercise by walking and lifting weights.  Does not smoke drinks on occasion.  No illicit drug use.  Sheltering at home with his wife.  Urine flow is good.  Nocturia if he drinks fluids before going to bed at night.  Moving bowels regularly.  Abdominal pain is resolved.  Past Medical History:  Diagnosis Date  . Allergic rhinitis    claritin in the spring  . Asthma    started in [employer]. sparing albuterol  . Chicken pox   . GERD (gastroesophageal reflux disease)   . History of adenomatous polyp of colon    age 49- repeat planned at 44  . Hypogonadism in male    by prior PCP. testosterone cypionate 297m/ml- 1 ml q 14 days. wife gives injection  . Nephrolithiasis 2010   x1 years ago    Past Surgical History:  Procedure Laterality Date  . CERVICAL DISC SURGERY     c6-c7 fusion- used to jump out of airplanes  . CHOLECYSTECTOMY  2005    Family History  Problem Relation Age of Onset  . Diabetes Mother        died at 848 . Hyperlipidemia Mother   . Hypertension Mother   . CAD Mother        1 year after husband passed  . Hypertension Father   . Asthma Father   . Stroke Father        died 956 after falling and breaking 2 vertebrae and getting PNA  . Breast cancer Sister   . Alcoholism Brother        [place] CAD Brother        cabg. smoker. 680 . Other Brother        opiate addiction  . Stomach cancer Maternal Grandmother        ? colon  . CAD Maternal Grandfather        died 561 drinker, not smoker  . Other Paternal Grandmother        pna 911 . CAD Paternal Grandfather        died 642 drink and smoking  . Lung cancer Maternal Uncle        smoker  . Colon  cancer Neg Hx   . Rectal cancer Neg Hx   . Esophageal cancer Neg Hx     Social History   Socioeconomic History  . Marital status: Married    Spouse name: Not on file  . Number of children: Not on file  . Years of education: Not on file  . Highest education level: Not on file  Occupational History  . Not on file  Social Needs  . Financial resource strain: Not on file  . Food insecurity    Worry: Not on file    Inability: Not on file  . Transportation needs    Medical: Not on file    Non-medical: Not on file  Tobacco Use  . Smoking status: Former Smoker    Packs/day: 0.75    Years: 7.00    Pack years: 5.25    Quit date: 10/28/1999    Years since quitting: 19.8  .  Smokeless tobacco: Never Used  Substance and Sexual Activity  . Alcohol use: Yes    Alcohol/week: 2.0 - 3.0 standard drinks    Types: 2 - 3 Standard drinks or equivalent per week  . Drug use: Never  . Sexual activity: Not on file  Lifestyle  . Physical activity    Days per week: Not on file    Minutes per session: Not on file  . Stress: Not on file  Relationships  . Social Herbalist on phone: Not on file    Gets together: Not on file    Attends religious service: Not on file    Active member of club or organization: Not on file    Attends meetings of clubs or organizations: Not on file    Relationship status: Not on file  . Intimate partner violence    Fear of current or ex partner: Not on file    Emotionally abused: Not on file    Physically abused: Not on file    Forced sexual activity: Not on file  Other Topics Concern  . Not on file  Social History Narrative   Remarried. 3 children- 53, 15 (from 1st marriage- mom had decided didn't want to be a mom), 76 in 2018. 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 albuterol (PROVENTIL HFA;VENTOLIN HFA) 108 (90 Base) MCG/ACT inhaler Inhale 2 puffs into the lungs every 6 (six) hours as needed for wheezing or shortness of breath. 1 Inhaler 2  . Cholecalciferol (D3 HIGH POTENCY) 2000 units CAPS Take 1 capsule by mouth daily.    . Multiple Vitamin (MULTIVITAMIN) tablet Take 1 tablet by mouth daily.    . sildenafil (REVATIO) 20 MG tablet Take 2-5 tablets by mouth 30-60 minutes prior to sexual intercourse 90 tablet 3  . Testosterone Cypionate 200 MG/ML KIT Inject 1 mL into the muscle every 14 (fourteen) days. 6 kit 1  . meloxicam (MOBIC) 15 MG tablet Take 1 tablet (15 mg total) by mouth daily. (Patient not taking: Reported on 05/06/2019) 30 tablet 0  . methocarbamol (ROBAXIN) 500 MG tablet Take 1 tablet (500 mg total) by mouth 3 (three) times daily. (Patient not taking: Reported on 05/06/2019) 40 tablet 0   No facility-administered medications prior to visit.     No Known Allergies  ROS Review of Systems  Constitutional: Negative for diaphoresis, fatigue, fever and unexpected weight change.  HENT: Negative.   Eyes: Negative for photophobia and visual disturbance.  Respiratory: Negative.   Cardiovascular: Negative.   Gastrointestinal: Negative.  Negative for anal bleeding, blood in stool, constipation and diarrhea.  Endocrine: Negative for polyphagia and polyuria.  Genitourinary: Negative.  Negative for difficulty urinating, frequency and urgency.  Allergic/Immunologic: Negative for immunocompromised state.  Neurological: Negative for seizures, syncope and speech difficulty.  Hematological: Does not bruise/bleed easily.  Psychiatric/Behavioral: Negative.       Objective:    Physical Exam  Constitutional: He is oriented to person, place, and time. He appears well-developed and well-nourished. No  distress.  HENT:  Head: Normocephalic and  atraumatic.  Right Ear: External ear normal.  Left Ear: External ear normal.  Mouth/Throat: Oropharynx is clear and moist. No oropharyngeal exudate.  Eyes: Pupils are equal, round, and reactive to light. Conjunctivae are normal. Right eye exhibits no discharge. Left eye exhibits no discharge. No scleral icterus.  Neck: Neck supple. No JVD present. No tracheal deviation present. No thyromegaly (tyroid nodule on left. ) present.  Cardiovascular: Normal rate, regular rhythm and normal heart sounds.  Pulmonary/Chest: Effort normal and breath sounds normal. No stridor.  Abdominal: Bowel sounds are normal. He exhibits no distension. There is no abdominal tenderness. There is no rebound and no guarding.  Musculoskeletal:        General: No edema.  Lymphadenopathy:    He has no cervical adenopathy.  Neurological: He is alert and oriented to person, place, and time.  Skin: Skin is warm and dry. He is not diaphoretic.  Psychiatric: He has a normal mood and affect. His behavior is normal.    BP 136/80   Pulse 74   Ht 6' 1" (1.854 m)   Wt 263 lb (119.3 kg)   SpO2 94%   BMI 34.70 kg/m  Wt Readings from Last 3 Encounters:  09/16/19 263 lb (119.3 kg)  05/06/19 260 lb (117.9 kg)  04/26/19 260 lb (117.9 kg)   BP Readings from Last 3 Encounters:  09/16/19 136/80  05/06/19 124/69  04/26/19 134/80   Guideline developer:  UpToDate (see UpToDate for funding source) Date Released: June 2014  There are no preventive care reminders to display for this patient.  There are no preventive care reminders to display for this patient.  No results found for: TSH Lab Results  Component Value Date   WBC 7.0 04/26/2019   HGB 15.9 04/26/2019   HCT 45.2 04/26/2019   MCV 90.6 04/26/2019   PLT 304 04/26/2019   Lab Results  Component Value Date   NA 140 09/17/2018   K 4.1 09/17/2018   CO2 27 09/17/2018   GLUCOSE 102 (H) 09/17/2018   BUN 23 09/17/2018    CREATININE 1.17 09/17/2018   BILITOT 0.6 09/17/2018   ALKPHOS 56 09/17/2018   AST 22 09/17/2018   ALT 21 09/17/2018   PROT 6.3 09/17/2018   ALBUMIN 4.1 09/17/2018   CALCIUM 8.8 09/17/2018   GFR 67.53 09/17/2018   Lab Results  Component Value Date   CHOL 163 09/17/2018   Lab Results  Component Value Date   HDL 45.10 09/17/2018   Lab Results  Component Value Date   LDLCALC 109 (H) 09/17/2018   Lab Results  Component Value Date   TRIG 44.0 09/17/2018   Lab Results  Component Value Date   CHOLHDL 4 09/17/2018   No results found for: HGBA1C    Assessment & Plan:   Problem List Items Addressed This Visit      Endocrine   Androgen deficiency   Relevant Orders   Testosterone   Thyroid nodule   Relevant Orders   TSH   US Soft Tissue Head/Neck     Other   Healthcare maintenance - Primary   Relevant Orders   CBC   Comp Met (CMET)   Lipid Profile   PSA   Urinalysis, Routine w reflex microscopic   Vitamin D deficiency   Relevant Orders   VITAMIN D 25 Hydroxy (Vit-D Deficiency, Fractures)      No orders of the defined types were placed in this encounter.   Follow-up: Return in about 1 year (around 09/15/2020),  or if symptoms worsen or fail to improve.   Encouraged him to maintain his healthy lifestyle.  He will return for above ordered blood work 1 week after his next testosterone injection.  Follow-up in 1 year or sooner as needed.

## 2019-09-26 ENCOUNTER — Other Ambulatory Visit: Payer: Self-pay

## 2019-09-27 ENCOUNTER — Other Ambulatory Visit (INDEPENDENT_AMBULATORY_CARE_PROVIDER_SITE_OTHER): Payer: PRIVATE HEALTH INSURANCE

## 2019-09-27 DIAGNOSIS — E559 Vitamin D deficiency, unspecified: Secondary | ICD-10-CM

## 2019-09-27 DIAGNOSIS — E041 Nontoxic single thyroid nodule: Secondary | ICD-10-CM

## 2019-09-27 DIAGNOSIS — Z0000 Encounter for general adult medical examination without abnormal findings: Secondary | ICD-10-CM

## 2019-09-27 DIAGNOSIS — E291 Testicular hypofunction: Secondary | ICD-10-CM | POA: Diagnosis not present

## 2019-09-27 LAB — URINALYSIS, ROUTINE W REFLEX MICROSCOPIC
Bilirubin Urine: NEGATIVE
Hgb urine dipstick: NEGATIVE
Ketones, ur: NEGATIVE
Leukocytes,Ua: NEGATIVE
Nitrite: NEGATIVE
RBC / HPF: NONE SEEN (ref 0–?)
Specific Gravity, Urine: 1.02 (ref 1.000–1.030)
Total Protein, Urine: NEGATIVE
Urine Glucose: NEGATIVE
Urobilinogen, UA: 0.2 (ref 0.0–1.0)
WBC, UA: NONE SEEN (ref 0–?)
pH: 5.5 (ref 5.0–8.0)

## 2019-09-27 LAB — CBC
HCT: 47 % (ref 39.0–52.0)
Hemoglobin: 15.9 g/dL (ref 13.0–17.0)
MCHC: 33.9 g/dL (ref 30.0–36.0)
MCV: 93.1 fl (ref 78.0–100.0)
Platelets: 257 10*3/uL (ref 150.0–400.0)
RBC: 5.05 Mil/uL (ref 4.22–5.81)
RDW: 13.4 % (ref 11.5–15.5)
WBC: 7.5 10*3/uL (ref 4.0–10.5)

## 2019-09-27 LAB — COMPREHENSIVE METABOLIC PANEL
ALT: 22 U/L (ref 0–53)
AST: 20 U/L (ref 0–37)
Albumin: 4 g/dL (ref 3.5–5.2)
Alkaline Phosphatase: 56 U/L (ref 39–117)
BUN: 18 mg/dL (ref 6–23)
CO2: 26 mEq/L (ref 19–32)
Calcium: 8.7 mg/dL (ref 8.4–10.5)
Chloride: 105 mEq/L (ref 96–112)
Creatinine, Ser: 1.07 mg/dL (ref 0.40–1.50)
GFR: 70.19 mL/min (ref >60.00)
Glucose, Bld: 105 mg/dL — ABNORMAL HIGH (ref 70–99)
Potassium: 4 mEq/L (ref 3.5–5.1)
Sodium: 138 mEq/L (ref 135–145)
Total Bilirubin: 0.6 mg/dL (ref 0.2–1.2)
Total Protein: 6.2 g/dL (ref 6.0–8.3)

## 2019-09-27 LAB — LIPID PANEL
Cholesterol: 181 mg/dL (ref 0–200)
HDL: 41 mg/dL (ref >39.00)
LDL Cholesterol: 128 mg/dL — ABNORMAL HIGH (ref 0–99)
NonHDL: 140.29
Total CHOL/HDL Ratio: 4
Triglycerides: 63 mg/dL (ref 0.0–149.0)
VLDL: 12.6 mg/dL (ref 0.0–40.0)

## 2019-09-27 LAB — VITAMIN D 25 HYDROXY (VIT D DEFICIENCY, FRACTURES): VITD: 33.21 ng/mL (ref 30.00–100.00)

## 2019-09-27 LAB — TESTOSTERONE: Testosterone: 615.83 ng/dL (ref 300.00–890.00)

## 2019-09-27 LAB — TSH: TSH: 1.93 u[IU]/mL (ref 0.35–4.50)

## 2019-09-27 LAB — PSA: PSA: 1.07 ng/mL (ref 0.10–4.00)

## 2019-10-03 ENCOUNTER — Other Ambulatory Visit: Payer: PRIVATE HEALTH INSURANCE

## 2019-10-03 ENCOUNTER — Ambulatory Visit
Admission: RE | Admit: 2019-10-03 | Discharge: 2019-10-03 | Disposition: A | Discharge status: Home / Self Care | Payer: PRIVATE HEALTH INSURANCE | Source: Ambulatory Visit | Attending: Family Medicine | Admitting: Family Medicine

## 2019-10-03 DIAGNOSIS — E041 Nontoxic single thyroid nodule: Secondary | ICD-10-CM

## 2019-12-05 ENCOUNTER — Other Ambulatory Visit: Payer: Self-pay | Admitting: Family Medicine

## 2019-12-05 DIAGNOSIS — E291 Testicular hypofunction: Secondary | ICD-10-CM

## 2020-02-05 ENCOUNTER — Other Ambulatory Visit: Payer: Self-pay | Admitting: Family Medicine

## 2020-02-05 DIAGNOSIS — N529 Male erectile dysfunction, unspecified: Secondary | ICD-10-CM

## 2020-02-22 ENCOUNTER — Telehealth (INDEPENDENT_AMBULATORY_CARE_PROVIDER_SITE_OTHER): Payer: PRIVATE HEALTH INSURANCE | Admitting: Nurse Practitioner

## 2020-02-22 ENCOUNTER — Other Ambulatory Visit: Payer: Self-pay

## 2020-02-22 ENCOUNTER — Encounter: Payer: Self-pay | Admitting: Nurse Practitioner

## 2020-02-22 VITALS — BP 126/72 | Ht 73.0 in | Wt 246.0 lb

## 2020-02-22 DIAGNOSIS — J0140 Acute pansinusitis, unspecified: Secondary | ICD-10-CM | POA: Diagnosis not present

## 2020-02-22 MED ORDER — MUCINEX 600 MG PO TB12: 600.0000 mg | ORAL_TABLET | Freq: Two times a day (BID) | ORAL | 0 refills | Status: AC | PRN

## 2020-02-22 MED ORDER — AZITHROMYCIN 250 MG PO TABS: 250.0000 mg | ORAL_TABLET | Freq: Every day | ORAL | 0 refills | Status: AC

## 2020-02-22 MED ORDER — METHYLPREDNISOLONE 4 MG PO TBPK: ORAL_TABLET | ORAL | 0 refills | Status: AC

## 2020-02-22 NOTE — Patient Instructions (Signed)
Start mucinex and medrol dose pack now If no improvement in 72hrs, start azithromycin. Continue saline sinus irrigation once daily. Stop any oral decongestant.

## 2020-02-22 NOTE — Progress Notes (Signed)
Virtual Visit via Video Note  I connected with@ on 02/22/20 at  1:00 PM EDT by a video enabled telemedicine application and verified that I am speaking with the correct person using two identifiers.  Location: Patient:Home Provider: Office Participants: patient and provider  I discussed the limitations of evaluation and management by telemedicine and the availability of in person appointments. I also discussed with the patient that there may be a patient responsible charge related to this service. The patient expressed understanding and agreed to proceed.  CC:Pt stated he has seasonal allergies but last 5 or 6 days spitting up green mucus//congestion in head with pressure and headache  History of Present Illness: Sinusitis This is a new problem. The current episode started in the past 7 days. The problem has been gradually worsening since onset. There has been no fever. Associated symptoms include congestion, coughing, headaches, sinus pressure and sneezing. Pertinent negatives include no chills, diaphoresis, ear pain, hoarse voice, neck pain, shortness of breath, sore throat or swollen glands. Past treatments include oral decongestants and saline sprays (and claritin). The treatment provided no relief.   Observations/Objective: Physical Exam  Constitutional: He is oriented to person, place, and time. No distress.  HENT:  Nose: Right sinus exhibits maxillary sinus tenderness and frontal sinus tenderness. Left sinus exhibits maxillary sinus tenderness and frontal sinus tenderness.  Pulmonary/Chest: Effort normal.  Musculoskeletal:     Cervical back: Normal range of motion and neck supple.  Neurological: He is alert and oriented to person, place, and time.  Vitals reviewed.  Assessment and Plan: Bohan was seen today for sinusitis.  Diagnoses and all orders for this visit:  Acute non-recurrent pansinusitis -     methylPREDNISolone (MEDROL DOSEPAK) 4 MG TBPK tablet; Take as directed  on package -     azithromycin (ZITHROMAX Z-PAK) 250 MG tablet; Take 1 tablet (250 mg total) by mouth daily. Take 2tabs on first day, then 1tab once a day till complete -     guaiFENesin (MUCINEX) 600 MG 12 hr tablet; Take 1 tablet (600 mg total) by mouth 2 (two) times daily as needed for cough or to loosen phlegm.   Follow Up Instructions: Start mucinex and medrol dose pack now If no improvement in 72hrs, start azithromycin. Continue saline sinus irrigation once daily. Stop any oral decongestant.  I discussed the assessment and treatment plan with the patient. The patient was provided an opportunity to ask questions and all were answered. The patient agreed with the plan and demonstrated an understanding of the instructions.   The patient was advised to call back or seek an in-person evaluation if the symptoms worsen or if the condition fails to improve as anticipated.  Wilfred Lacy, NP

## 2020-04-02 ENCOUNTER — Other Ambulatory Visit: Payer: Self-pay | Admitting: Family Medicine

## 2020-04-02 DIAGNOSIS — E291 Testicular hypofunction: Secondary | ICD-10-CM

## 2020-04-02 NOTE — Telephone Encounter (Signed)
Last OV 09/16/19 Last VV 02/22/20 Last fill 12/06/19 #75ml/0

## 2020-06-14 ENCOUNTER — Other Ambulatory Visit: Payer: Self-pay | Admitting: Family Medicine

## 2020-06-14 DIAGNOSIS — E291 Testicular hypofunction: Secondary | ICD-10-CM

## 2020-06-16 ENCOUNTER — Other Ambulatory Visit: Payer: Self-pay | Admitting: Family Medicine

## 2020-06-16 DIAGNOSIS — N529 Male erectile dysfunction, unspecified: Secondary | ICD-10-CM

## 2020-06-27 IMAGING — US US THYROID
1 series · 14 of 25 positions shown · non-contrast
Comparison: None.

CLINICAL DATA: Left-sided thyroid nodule.

EXAM:
THYROID ULTRASOUND
TECHNIQUE: Ultrasound examination of the thyroid gland and adjacent soft
tissues was performed.

[Series 1: us thyroid · 0.05mm/px · 14 of 47 slices shown]
[im 1/47]
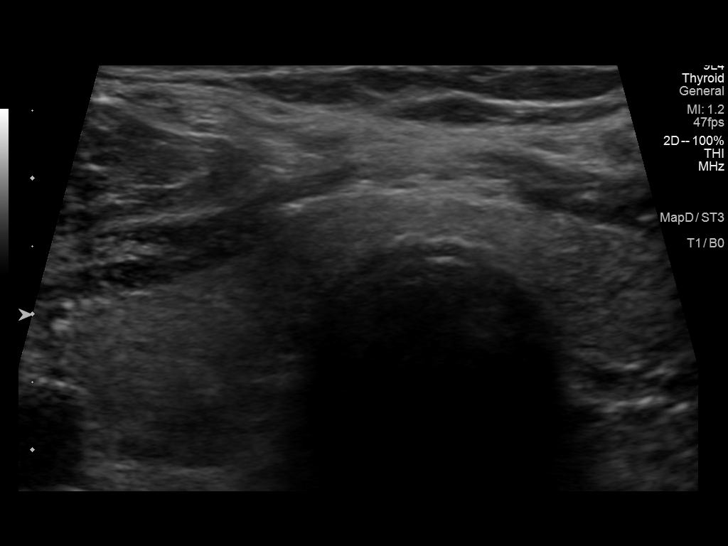
[im 4/47]
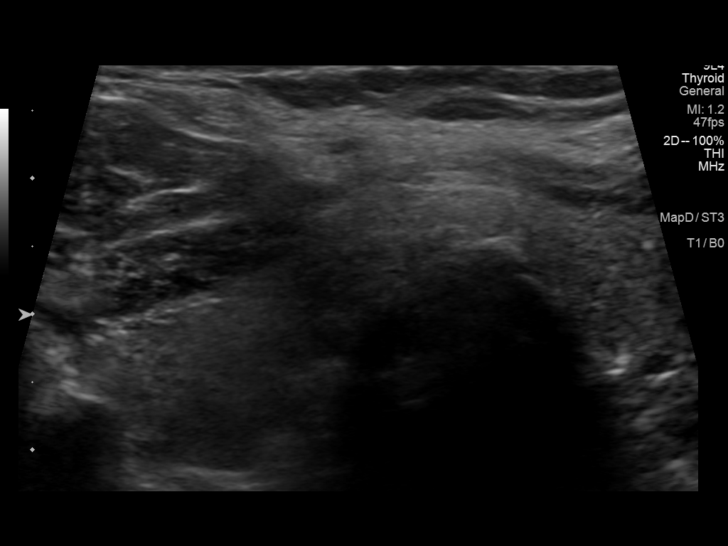
[im 8/47]
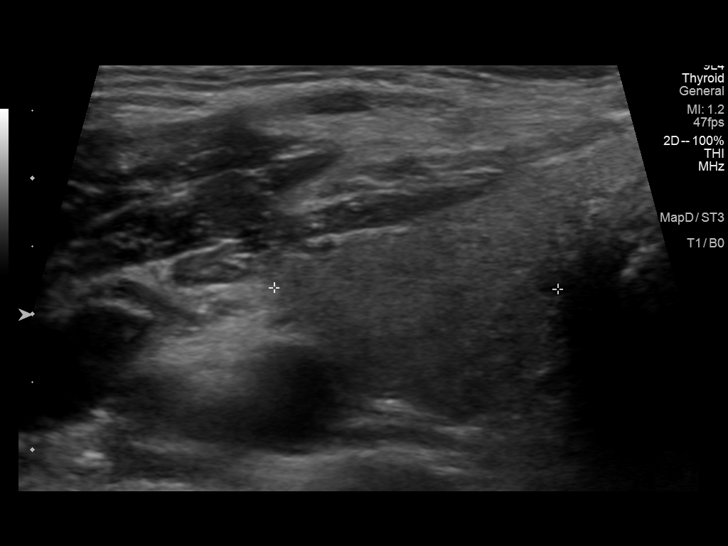
[im 12/47]
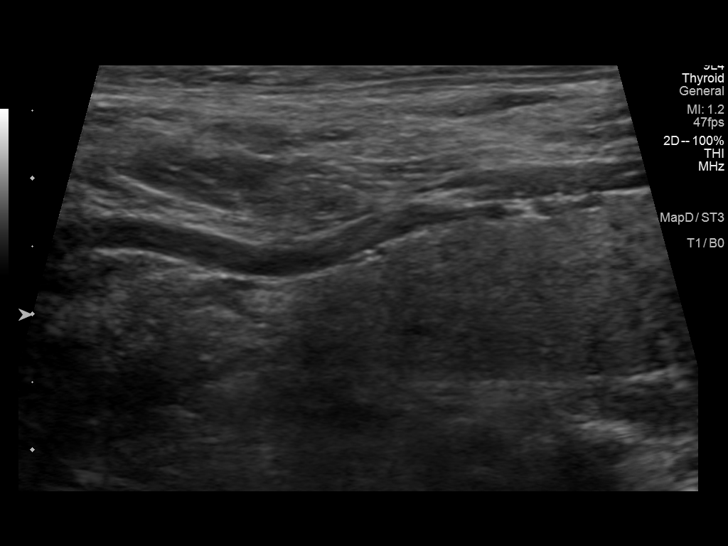
[im 16/47]
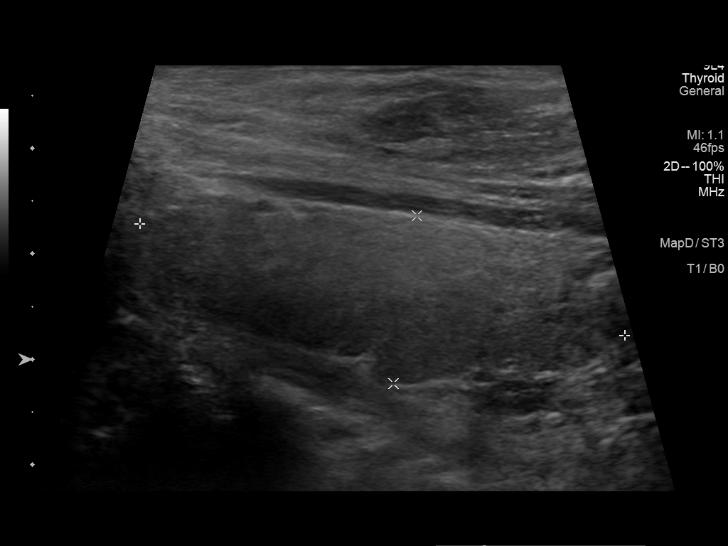
[im 18/47]
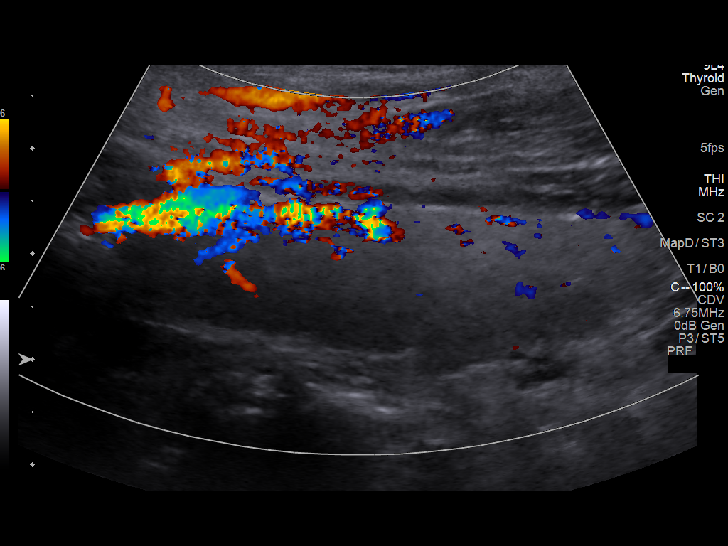
[im 22/47]
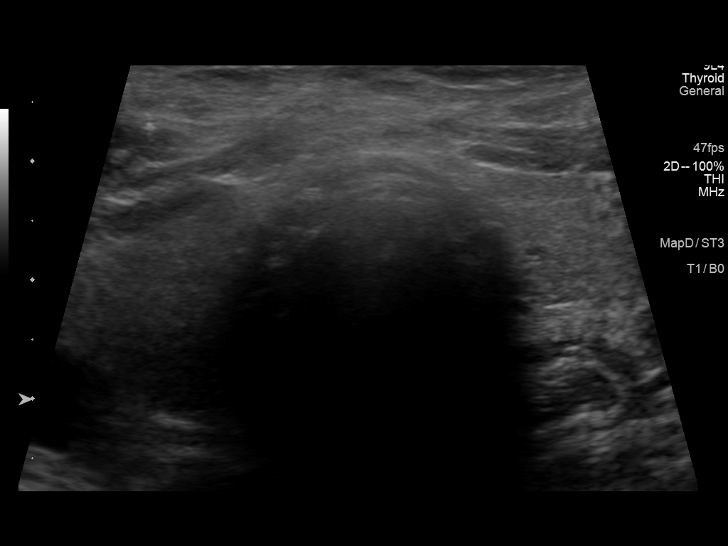
[im 25/47]
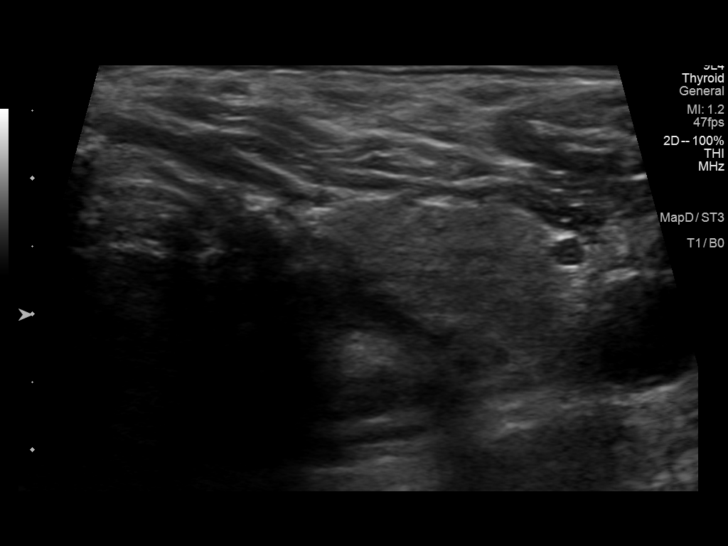
[im 29/47]
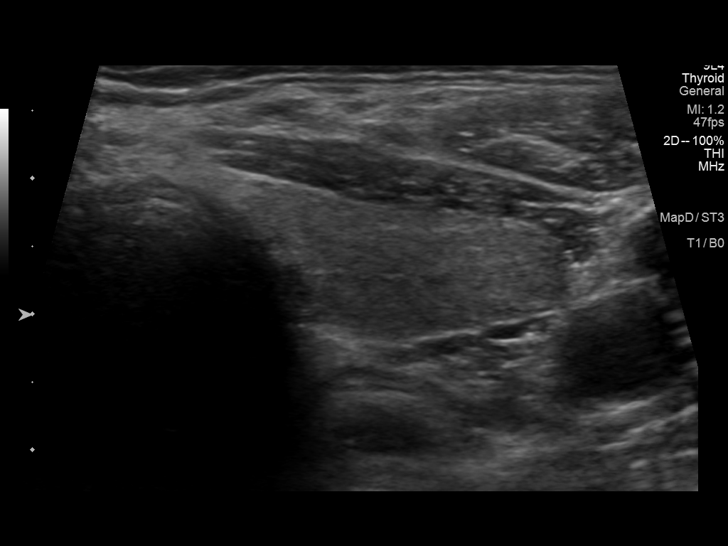
[im 31/47]
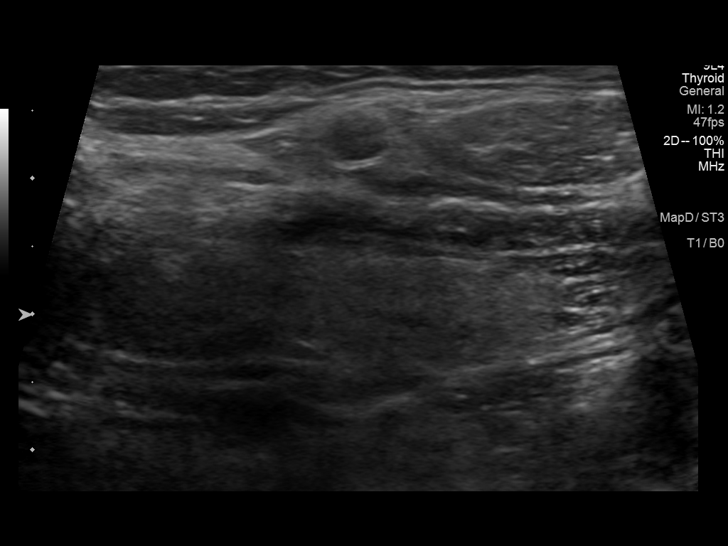
[im 35/47]
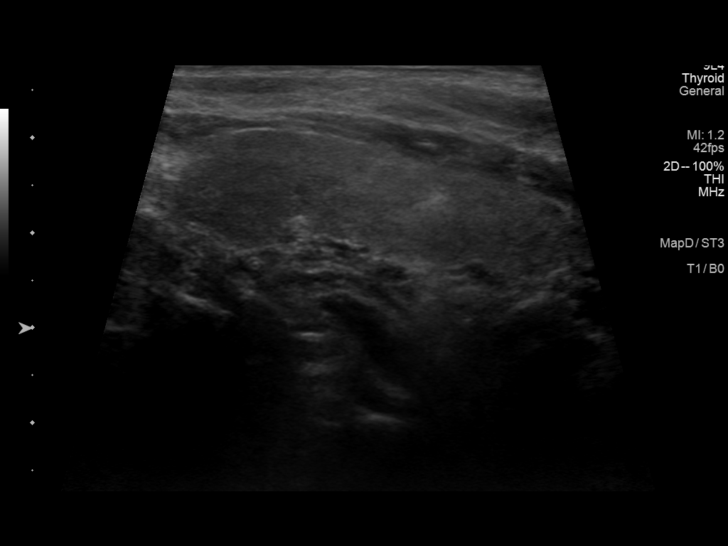
[im 39/47]
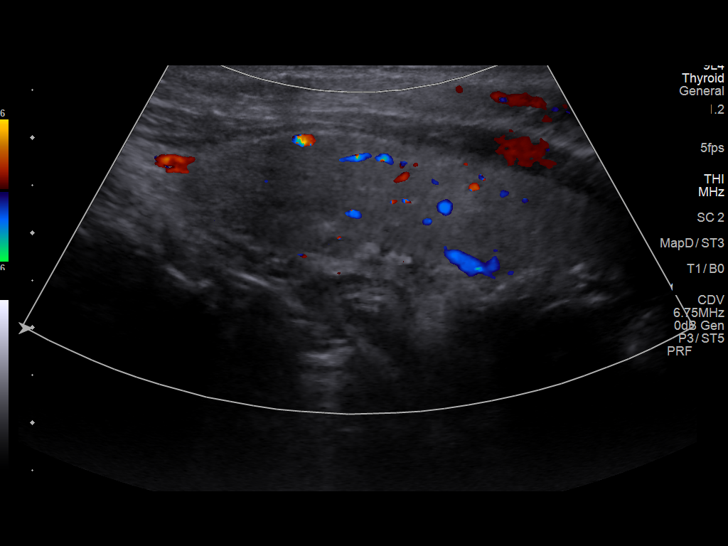
[im 43/47]
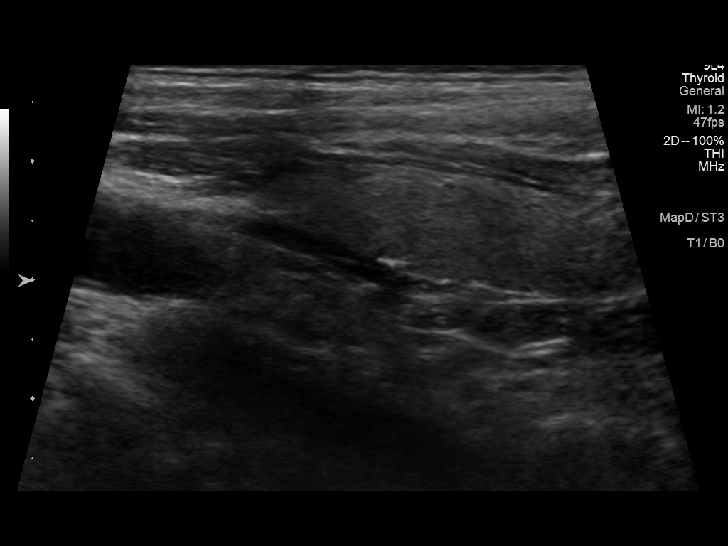
[im 47/47]
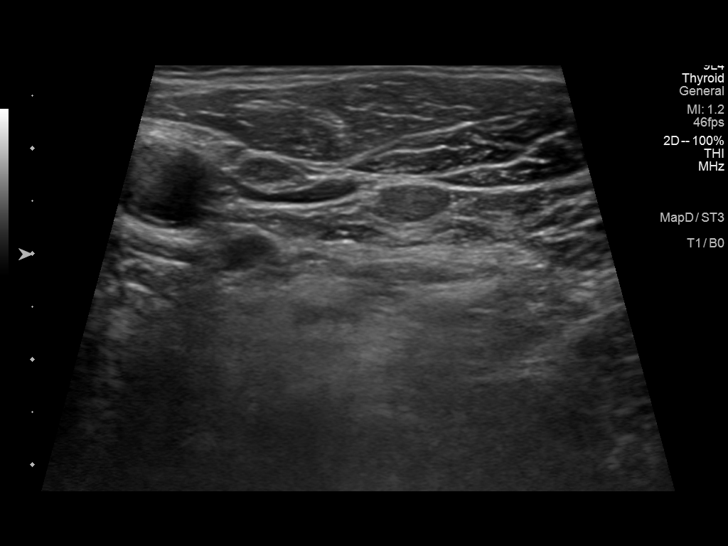

[14 of 25 positions shown; findings below may reference images not displayed]

FINDINGS: Parenchymal Echotexture: Normal

Isthmus: 0.5 cm

Right lobe: 4.7 x 1.6 x 2.1 cm

Left lobe: 4.5 x 1.3 x 1.8 cm

_________________________________________________________

Estimated total number of nodules >/= 1 cm: 0

Number of spongiform nodules >/=  2 cm not described below (TR1): 0

Number of mixed cystic and solid nodules >/= 1.5 cm not described
below (TR2): 0

_________________________________________________________

No discrete nodules are seen within the thyroid gland.
IMPRESSION: Normal study with no evidence for a distinct thyroid nodule.

The above is in keeping with the ACR TI-RADS recommendations - [HOSPITAL] 2019;[DATE].

## 2020-12-06 ENCOUNTER — Telehealth: Payer: Self-pay

## 2020-12-06 NOTE — Telephone Encounter (Signed)
PA for Testosterone 200 mg/ml submitted through cover my meds to [facility]. Awaiting response.  Dm/cma  XBM:[id number]

## 2020-12-06 NOTE — Telephone Encounter (Signed)
PA Case: [id number], Status: Approved, Coverage Starts on: 12/06/2020 12:00:00 AM, Coverage Ends on: 12/06/2021 12:00:00 AM.  Dm/cma

## 2021-04-07 ENCOUNTER — Other Ambulatory Visit: Payer: Self-pay | Admitting: Family Medicine

## 2021-04-07 DIAGNOSIS — N529 Male erectile dysfunction, unspecified: Secondary | ICD-10-CM

## 2021-07-14 ENCOUNTER — Other Ambulatory Visit: Payer: Self-pay | Admitting: Family Medicine

## 2021-07-14 DIAGNOSIS — N529 Male erectile dysfunction, unspecified: Secondary | ICD-10-CM

## 2021-07-17 ENCOUNTER — Telehealth: Payer: Self-pay

## 2021-07-17 NOTE — Telephone Encounter (Signed)
PA for Sildenafil  20 mg tabs submitted to [facility] through cover my meds.  Awaitng response.  Dm/cma   Key: [id number]

## 2021-07-18 NOTE — Telephone Encounter (Signed)
Received a denial on PA for Sildenafill . 20 mg.  Will call patient to advise he can try to use a Good RX card.  Dm/cma

## 2021-07-18 NOTE — Telephone Encounter (Signed)
Unable to leave VM for a rtn call. Dm/cma

## 2022-10-06 ENCOUNTER — Other Ambulatory Visit: Payer: Self-pay | Admitting: Family Medicine

## 2022-10-06 DIAGNOSIS — N529 Male erectile dysfunction, unspecified: Secondary | ICD-10-CM
